# Patient Record
Sex: Male | Born: 1990 | Race: Black or African American | Hispanic: No | Marital: Single | Smoking: Former smoker
Health system: Southern US, Community
[De-identification: ages and names within clinical notes are randomized; demographics above are authoritative.]

## PROBLEM LIST (undated history)

## (undated) DIAGNOSIS — R109 Unspecified abdominal pain: Principal | ICD-10-CM

## (undated) DIAGNOSIS — J45909 Unspecified asthma, uncomplicated: Secondary | ICD-10-CM

## (undated) DIAGNOSIS — G8929 Other chronic pain: Secondary | ICD-10-CM

## (undated) HISTORY — DX: Other chronic pain: G89.29

## (undated) HISTORY — DX: Unspecified abdominal pain: R10.9

---
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
## 2008-09-20 ENCOUNTER — Emergency Department (HOSPITAL_COMMUNITY): Admission: EM | Admit: 2008-09-20 | Discharge: 2008-09-20 | Payer: Self-pay | Admitting: Emergency Medicine

## 2008-09-22 ENCOUNTER — Emergency Department (HOSPITAL_COMMUNITY): Admission: EM | Admit: 2008-09-22 | Discharge: 2008-09-22 | Payer: Self-pay | Admitting: Emergency Medicine

## 2010-12-10 ENCOUNTER — Emergency Department (HOSPITAL_COMMUNITY)
Admission: EM | Admit: 2010-12-10 | Discharge: 2010-12-10 | Disposition: A | Discharge status: Home / Self Care | Payer: Self-pay | Attending: Emergency Medicine | Admitting: Emergency Medicine

## 2010-12-10 DIAGNOSIS — IMO0002 Reserved for concepts with insufficient information to code with codable children: Secondary | ICD-10-CM | POA: Insufficient documentation

## 2010-12-10 DIAGNOSIS — B356 Tinea cruris: Secondary | ICD-10-CM | POA: Insufficient documentation

## 2010-12-10 DIAGNOSIS — W268XXA Contact with other sharp object(s), not elsewhere classified, initial encounter: Secondary | ICD-10-CM | POA: Insufficient documentation

## 2010-12-10 DIAGNOSIS — T22019A Burn of unspecified degree of unspecified forearm, initial encounter: Secondary | ICD-10-CM | POA: Insufficient documentation

## 2010-12-10 DIAGNOSIS — R21 Rash and other nonspecific skin eruption: Secondary | ICD-10-CM | POA: Insufficient documentation

## 2011-01-02 LAB — DIFFERENTIAL
Basophils Absolute: 0 10*3/uL (ref 0.0–0.1)
Basophils Relative: 0 % (ref 0–1)
Basophils Relative: 1 % (ref 0–1)
Eosinophils Absolute: 0.2 10*3/uL (ref 0.0–0.7)
Eosinophils Relative: 2 % (ref 0–5)
Lymphocytes Relative: 47 % — ABNORMAL HIGH (ref 12–46)
Lymphocytes Relative: 7 % — ABNORMAL LOW (ref 12–46)
Lymphs Abs: 1 10*3/uL (ref 0.7–4.0)
Lymphs Abs: 2.6 10*3/uL (ref 0.7–4.0)
Monocytes Absolute: 0.4 10*3/uL (ref 0.1–1.0)
Monocytes Absolute: 1.2 10*3/uL — ABNORMAL HIGH (ref 0.1–1.0)
Monocytes Relative: 8 % (ref 3–12)
Monocytes Relative: 8 % (ref 3–12)
Neutro Abs: 11.7 10*3/uL — ABNORMAL HIGH (ref 1.7–7.7)
Neutro Abs: 2 10*3/uL (ref 1.7–7.7)
Neutrophils Relative %: 83 % — ABNORMAL HIGH (ref 43–77)

## 2011-01-02 LAB — BASIC METABOLIC PANEL
BUN: 9 mg/dL (ref 6–23)
CO2: 26 mEq/L (ref 19–32)
Calcium: 10.1 mg/dL (ref 8.4–10.5)
Chloride: 102 mEq/L (ref 96–112)
Creatinine, Ser: 1.24 mg/dL (ref 0.4–1.5)
GFR calc Af Amer: >60 mL/min (ref >60)
GFR calc non Af Amer: >60 mL/min (ref >60)
Glucose, Bld: 112 mg/dL — ABNORMAL HIGH (ref 70–99)
Potassium: 3.8 mEq/L (ref 3.5–5.1)
Sodium: 138 mEq/L (ref 135–145)

## 2011-01-02 LAB — URINALYSIS, ROUTINE W REFLEX MICROSCOPIC
Bilirubin Urine: NEGATIVE
Glucose, UA: NEGATIVE mg/dL
Glucose, UA: NEGATIVE mg/dL
Hgb urine dipstick: NEGATIVE
Ketones, ur: 15 mg/dL — AB
Ketones, ur: NEGATIVE mg/dL
Leukocytes, UA: NEGATIVE
Nitrite: NEGATIVE
Nitrite: NEGATIVE
Protein, ur: >300 mg/dL — AB
Protein, ur: NEGATIVE mg/dL
Specific Gravity, Urine: 1.026 (ref 1.005–1.030)
Specific Gravity, Urine: 1.03 (ref 1.005–1.030)
Urobilinogen, UA: 0.2 mg/dL (ref 0.0–1.0)
Urobilinogen, UA: 1 mg/dL (ref 0.0–1.0)
pH: 6 (ref 5.0–8.0)
pH: 6.5 (ref 5.0–8.0)

## 2011-01-02 LAB — COMPREHENSIVE METABOLIC PANEL
Albumin: 3.5 g/dL (ref 3.5–5.2)
Alkaline Phosphatase: 71 U/L (ref 39–117)
BUN: 10 mg/dL (ref 6–23)
Potassium: 4.1 mEq/L (ref 3.5–5.1)
Total Protein: 6.4 g/dL (ref 6.0–8.3)

## 2011-01-02 LAB — CBC
HCT: 43.5 % (ref 39.0–52.0)
HCT: 50.4 % (ref 39.0–52.0)
Hemoglobin: 16.3 g/dL (ref 13.0–17.0)
MCHC: 32.3 g/dL (ref 30.0–36.0)
MCV: 83.8 fL (ref 78.0–100.0)
Platelets: 219 10*3/uL (ref 150–400)
Platelets: 232 10*3/uL (ref 150–400)
RBC: 6.01 MIL/uL — ABNORMAL HIGH (ref 4.22–5.81)
RDW: 13.7 % (ref 11.5–15.5)
RDW: 13.9 % (ref 11.5–15.5)
WBC: 14.2 10*3/uL — ABNORMAL HIGH (ref 4.0–10.5)

## 2011-01-02 LAB — URINE MICROSCOPIC-ADD ON

## 2012-10-18 ENCOUNTER — Emergency Department (HOSPITAL_COMMUNITY)
Admission: EM | Admit: 2012-10-18 | Discharge: 2012-10-18 | Payer: Self-pay | Attending: Emergency Medicine | Admitting: Emergency Medicine

## 2012-10-18 ENCOUNTER — Emergency Department (HOSPITAL_COMMUNITY): Payer: Self-pay

## 2012-10-18 ENCOUNTER — Encounter (HOSPITAL_COMMUNITY): Payer: Self-pay | Admitting: *Deleted

## 2012-10-18 DIAGNOSIS — S0093XA Contusion of unspecified part of head, initial encounter: Secondary | ICD-10-CM

## 2012-10-18 DIAGNOSIS — S1093XA Contusion of unspecified part of neck, initial encounter: Secondary | ICD-10-CM | POA: Insufficient documentation

## 2012-10-18 DIAGNOSIS — S0003XA Contusion of scalp, initial encounter: Secondary | ICD-10-CM | POA: Insufficient documentation

## 2012-10-18 DIAGNOSIS — T07XXXA Unspecified multiple injuries, initial encounter: Secondary | ICD-10-CM

## 2012-10-18 DIAGNOSIS — S8990XA Unspecified injury of unspecified lower leg, initial encounter: Secondary | ICD-10-CM | POA: Insufficient documentation

## 2012-10-18 DIAGNOSIS — S99929A Unspecified injury of unspecified foot, initial encounter: Secondary | ICD-10-CM | POA: Insufficient documentation

## 2012-10-18 DIAGNOSIS — F17200 Nicotine dependence, unspecified, uncomplicated: Secondary | ICD-10-CM | POA: Insufficient documentation

## 2012-10-18 DIAGNOSIS — H10219 Acute toxic conjunctivitis, unspecified eye: Secondary | ICD-10-CM | POA: Insufficient documentation

## 2012-10-18 DIAGNOSIS — J45909 Unspecified asthma, uncomplicated: Secondary | ICD-10-CM | POA: Insufficient documentation

## 2012-10-18 DIAGNOSIS — S0993XA Unspecified injury of face, initial encounter: Secondary | ICD-10-CM | POA: Insufficient documentation

## 2012-10-18 DIAGNOSIS — IMO0002 Reserved for concepts with insufficient information to code with codable children: Secondary | ICD-10-CM | POA: Insufficient documentation

## 2012-10-18 HISTORY — DX: Unspecified asthma, uncomplicated: J45.909

## 2012-10-18 MED ORDER — TETRACAINE HCL 0.5 % OP SOLN
1.0000 [drp] | Freq: Once | OPHTHALMIC | Status: AC
  Administered 2012-10-18: 2 [drp] via OPHTHALMIC
  Filled 2012-10-18: qty 2
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
## 2015-02-05 ENCOUNTER — Emergency Department (HOSPITAL_COMMUNITY): Payer: Self-pay

## 2015-02-05 ENCOUNTER — Emergency Department (HOSPITAL_COMMUNITY)
Admission: EM | Admit: 2015-02-05 | Discharge: 2015-02-05 | Disposition: A | Discharge status: Home / Self Care | Payer: Self-pay | Attending: Emergency Medicine | Admitting: Emergency Medicine

## 2015-02-05 ENCOUNTER — Encounter (HOSPITAL_COMMUNITY): Payer: Self-pay | Admitting: *Deleted

## 2015-02-05 DIAGNOSIS — R1084 Generalized abdominal pain: Secondary | ICD-10-CM

## 2015-02-05 DIAGNOSIS — K2970 Gastritis, unspecified, without bleeding: Secondary | ICD-10-CM | POA: Insufficient documentation

## 2015-02-05 DIAGNOSIS — J45909 Unspecified asthma, uncomplicated: Secondary | ICD-10-CM | POA: Insufficient documentation

## 2015-02-05 DIAGNOSIS — Z720 Tobacco use: Secondary | ICD-10-CM | POA: Insufficient documentation

## 2015-02-05 LAB — COMPREHENSIVE METABOLIC PANEL
ALT: 22 U/L (ref 17–63)
AST: 21 U/L (ref 15–41)
Albumin: 4.3 g/dL (ref 3.5–5.0)
Alkaline Phosphatase: 58 U/L (ref 38–126)
Anion gap: 9 (ref 5–15)
BUN: 10 mg/dL (ref 6–20)
CO2: 29 mmol/L (ref 22–32)
Calcium: 9.3 mg/dL (ref 8.9–10.3)
Chloride: 101 mmol/L (ref 101–111)
Creatinine, Ser: 1 mg/dL (ref 0.61–1.24)
GFR calc Af Amer: >60 mL/min (ref >60)
GFR calc non Af Amer: >60 mL/min (ref >60)
Glucose, Bld: 93 mg/dL (ref 65–99)
Potassium: 4.1 mmol/L (ref 3.5–5.1)
Sodium: 139 mmol/L (ref 135–145)
Total Bilirubin: 0.3 mg/dL (ref 0.3–1.2)
Total Protein: 7.3 g/dL (ref 6.5–8.1)

## 2015-02-05 LAB — CBC WITH DIFFERENTIAL/PLATELET
Basophils Absolute: 0 10*3/uL (ref 0.0–0.1)
Basophils Relative: 0 % (ref 0–1)
Eosinophils Absolute: 0.6 10*3/uL (ref 0.0–0.7)
Eosinophils Relative: 12 % — ABNORMAL HIGH (ref 0–5)
HCT: 43.8 % (ref 39.0–52.0)
Hemoglobin: 14.5 g/dL (ref 13.0–17.0)
Lymphocytes Relative: 35 % (ref 12–46)
Lymphs Abs: 1.6 10*3/uL (ref 0.7–4.0)
MCH: 26.9 pg (ref 26.0–34.0)
MCHC: 33.1 g/dL (ref 30.0–36.0)
MCV: 81.1 fL (ref 78.0–100.0)
Monocytes Absolute: 0.7 10*3/uL (ref 0.1–1.0)
Monocytes Relative: 15 % — ABNORMAL HIGH (ref 3–12)
Neutro Abs: 1.7 10*3/uL (ref 1.7–7.7)
Neutrophils Relative %: 38 % — ABNORMAL LOW (ref 43–77)
Platelets: 172 10*3/uL (ref 150–400)
RBC: 5.4 MIL/uL (ref 4.22–5.81)
RDW: 13.3 % (ref 11.5–15.5)
WBC: 4.6 10*3/uL (ref 4.0–10.5)

## 2015-02-05 LAB — URINALYSIS, ROUTINE W REFLEX MICROSCOPIC
Bilirubin Urine: NEGATIVE
Glucose, UA: NEGATIVE mg/dL
Hgb urine dipstick: NEGATIVE
Ketones, ur: NEGATIVE mg/dL
Leukocytes, UA: NEGATIVE
Nitrite: NEGATIVE
Protein, ur: NEGATIVE mg/dL
Specific Gravity, Urine: 1.021 (ref 1.005–1.030)
Urobilinogen, UA: 0.2 mg/dL (ref 0.0–1.0)
pH: 7 (ref 5.0–8.0)

## 2015-02-05 LAB — LIPASE, BLOOD: Lipase: 16 U/L — ABNORMAL LOW (ref 22–51)

## 2015-02-05 MED ORDER — SUCRALFATE 1 G PO TABS: 1.0000 g | ORAL_TABLET | Freq: Three times a day (TID) | ORAL | Status: DC

## 2015-02-05 MED ORDER — ESOMEPRAZOLE MAGNESIUM 40 MG PO CPDR: 40.0000 mg | DELAYED_RELEASE_CAPSULE | Freq: Every day | ORAL | Status: DC

## 2015-02-05 MED ORDER — IOHEXOL 300 MG/ML  SOLN
50.0000 mL | Freq: Once | INTRAMUSCULAR | Status: AC | PRN
  Administered 2015-02-05: 50 mL via ORAL

## 2015-02-05 MED ORDER — MORPHINE SULFATE 4 MG/ML IJ SOLN
4.0000 mg | Freq: Once | INTRAMUSCULAR | Status: AC
  Administered 2015-02-05: 4 mg via INTRAVENOUS
  Filled 2015-02-05: qty 1

## 2015-02-05 MED ORDER — IOHEXOL 300 MG/ML  SOLN
100.0000 mL | Freq: Once | INTRAMUSCULAR | Status: AC | PRN
  Administered 2015-02-05: 100 mL via INTRAVENOUS

## 2015-02-05 MED ORDER — SODIUM CHLORIDE 0.9 % IV BOLUS (SEPSIS)
1000.0000 mL | Freq: Once | INTRAVENOUS | Status: AC
  Administered 2015-02-05: 1000 mL via INTRAVENOUS

## 2015-02-05 MED ORDER — ONDANSETRON HCL 4 MG/2ML IJ SOLN
4.0000 mg | Freq: Once | INTRAMUSCULAR | Status: AC
  Administered 2015-02-05: 4 mg via INTRAVENOUS
  Filled 2015-02-05: qty 2
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
## 2015-02-05 NOTE — ED Notes (Signed)
Patient states for about 4 months he has these periods were he can not eat. He feels like there is a knot in his stomach and when this happens he is unable to eat or drink. He has experience weight loss due to not being able to eat. He states it will sometimes let up and he can eat. He states he has passed out but thinks it is due to not eating.

## 2015-02-05 NOTE — ED Notes (Signed)
Pt given urinal, attempting to urinate at this time.

## 2015-02-05 NOTE — ED Provider Notes (Signed)
CSN: [id number]     Arrival date & time 02/05/15  1011 History   First MD Initiated Contact with Patient 02/05/15 1034     Chief Complaint  Patient presents with  . Abdominal Pain     (Consider location/radiation/quality/duration/timing/severity/associated sxs/prior Treatment) HPI Patient presents to the emergency department with abdominal pain that has been ongoing for quite a while.  He states that over the last few months, gotten worse.  He states that he has difficulty eating because the pain is worse after eating.  He states that sometimes he has vomiting.  The patient states that he was seen several years ago for the similar type pain and states that they did not find anything wrong at that time.  The patient states that he does not have any diarrhea, weakness, dizziness, headache, blurred vision, chest pain, shortness of breath, fever, cough, back pain, dysuria, or syncope.  The patient states that seems make his condition, better or worse. Past Medical History  Diagnosis Date  . Asthma    History reviewed. No pertinent past surgical history. History reviewed. No pertinent family history. History  Substance Use Topics  . Smoking status: Current Every Day Smoker  . Smokeless tobacco: Not on file  . Alcohol Use: Yes    Review of Systems  All other systems negative except as documented in the HPI. All pertinent positives and negatives as reviewed in the HPI.  Allergies  Review of patient's allergies indicates no known allergies.  Home Medications   Prior to Admission medications   Not on File   BP 121/44 mmHg  Pulse 50  Temp(Src) 97.5 F (36.4 C) (Oral)  Resp 16  SpO2 100% Physical Exam  Constitutional: He is oriented to person, place, and time. He appears well-developed and well-nourished. No distress.  HENT:  Head: Normocephalic and atraumatic.  Mouth/Throat: Oropharynx is clear and moist.  Eyes: Pupils are equal, round, and reactive to light.  Neck: Normal  range of motion. Neck supple.  Cardiovascular: Normal rate, regular rhythm and normal heart sounds.  Exam reveals no gallop and no friction rub.   No murmur heard. Pulmonary/Chest: Effort normal and breath sounds normal. No respiratory distress.  Abdominal: Soft. Bowel sounds are normal. He exhibits no distension. There is tenderness. There is no rebound and no guarding.  Musculoskeletal: He exhibits no edema.  Neurological: He is alert and oriented to person, place, and time. He exhibits normal muscle tone. Coordination normal.  Skin: Skin is warm and dry. No rash noted. No erythema.  Psychiatric: He has a normal mood and affect. His behavior is normal.  Nursing note and vitals reviewed.   ED Course  Procedures (including critical care time) Labs Review Labs Reviewed  CBC WITH DIFFERENTIAL/PLATELET - Abnormal; Notable for the following:    Neutrophils Relative % 38 (*)    Monocytes Relative 15 (*)    Eosinophils Relative 12 (*)    All other components within normal limits  LIPASE, BLOOD - Abnormal; Notable for the following:    Lipase 16 (*)    All other components within normal limits  URINALYSIS, ROUTINE W REFLEX MICROSCOPIC  COMPREHENSIVE METABOLIC PANEL    Imaging Review Ct Abdomen Pelvis W Contrast  02/05/2015   CLINICAL DATA:  For bowel 4 months the patient has had episodes where he cannot eat. Mid abdominal pain and weight loss.  EXAM: CT ABDOMEN AND PELVIS WITH CONTRAST  TECHNIQUE: Multidetector CT imaging of the abdomen and pelvis was performed using the standard  protocol following bolus administration of intravenous contrast.  CONTRAST:  50mL OMNIPAQUE IOHEXOL 300 MG/ML SOLN, 100mL OMNIPAQUE IOHEXOL 300 MG/ML SOLN  COMPARISON:  CT of the abdomen and pelvis on 09/20/2008  FINDINGS: Lower chest: The lung bases are unremarkable.  Heart size is normal.  Upper abdomen: No focal abnormality identified within the liver, spleen, pancreas, or adrenal glands. The gallbladder is  present.  Gastrointestinal tract: The stomach and small bowel loops are normal in appearance. The appendix is well seen and has a normal appearance. Colonic loops are normal in appearance.  Pelvis: There is no free pelvic fluid. Urinary bladder and prostate have a normal appearance.  Retroperitoneum: No retroperitoneal or mesenteric adenopathy. No evidence for aortic aneurysm.  Abdominal wall: Unremarkable.  Osseous structures: Unremarkable.  IMPRESSION: No evidence for acute abnormality of the abdomen or pelvis.   Electronically Signed   By: Norva PavlovElizabeth  Brown M.D.   On: 02/05/2015 13:34    Patient will be referred to GI for further evaluation and care.  He will be placed on Carafate, Nexium.  Told to return here as needed.  The CT scan did not show any abnormalities.  Patient is advised plan and all questions were answered    Charlestine NightChristopher Adriane Guglielmo, PA-C 02/05/15 1437  Azalia BilisKevin Campos, MD 02/05/15 1535

## 2015-02-05 NOTE — Discharge Instructions (Signed)
Return here as needed.  Follow-up with the GI Dr. provided the CT scan and laboratory testing did not show any significant abnormality

## 2015-02-05 NOTE — Progress Notes (Signed)
CM spoke with pt who confirms self pay [place] resident with no pcp. CM discussed and provided written information for self pay pcps vs EDPs, importance of pcp for f/u care, www.needymeds.org, www.goodrx.com, discounted pharmacies and other [place] resources such as [facility] , [facility], affordable care act,  financial assistance, self pay dental services, [place] med assist, DSS and  health department  Reviewed resources for [place] self pay pcps like [facility], family medicine at [address], [facility], general medical clinics, [facility] urgent [facility] plus others, medication resources, [facility] out patient pharmacies and housing Pt voiced understanding and appreciation of resources provided   Provided [facility] contact information Pt agreed to a referral Cm completed referral Pt to be contact by [facility] clinical liason  

## 2015-02-05 NOTE — ED Notes (Signed)
Pt made aware we need a urine sample

## 2015-02-12 ENCOUNTER — Emergency Department (HOSPITAL_COMMUNITY)
Admission: EM | Admit: 2015-02-12 | Discharge: 2015-02-12 | Disposition: A | Discharge status: Home / Self Care | Payer: Self-pay | Attending: Emergency Medicine | Admitting: Emergency Medicine

## 2015-02-12 ENCOUNTER — Emergency Department (HOSPITAL_COMMUNITY): Payer: Self-pay

## 2015-02-12 ENCOUNTER — Encounter (HOSPITAL_COMMUNITY): Payer: Self-pay | Admitting: *Deleted

## 2015-02-12 DIAGNOSIS — Y999 Unspecified external cause status: Secondary | ICD-10-CM | POA: Insufficient documentation

## 2015-02-12 DIAGNOSIS — IMO0002 Reserved for concepts with insufficient information to code with codable children: Secondary | ICD-10-CM

## 2015-02-12 DIAGNOSIS — R200 Anesthesia of skin: Secondary | ICD-10-CM | POA: Insufficient documentation

## 2015-02-12 DIAGNOSIS — Y9361 Activity, american tackle football: Secondary | ICD-10-CM | POA: Insufficient documentation

## 2015-02-12 DIAGNOSIS — Y280XXA Contact with sharp glass, undetermined intent, initial encounter: Secondary | ICD-10-CM | POA: Insufficient documentation

## 2015-02-12 DIAGNOSIS — Z23 Encounter for immunization: Secondary | ICD-10-CM | POA: Insufficient documentation

## 2015-02-12 DIAGNOSIS — Y92321 Football field as the place of occurrence of the external cause: Secondary | ICD-10-CM | POA: Insufficient documentation

## 2015-02-12 DIAGNOSIS — Z87891 Personal history of nicotine dependence: Secondary | ICD-10-CM | POA: Insufficient documentation

## 2015-02-12 DIAGNOSIS — Z79899 Other long term (current) drug therapy: Secondary | ICD-10-CM | POA: Insufficient documentation

## 2015-02-12 DIAGNOSIS — R202 Paresthesia of skin: Secondary | ICD-10-CM | POA: Insufficient documentation

## 2015-02-12 DIAGNOSIS — S40022A Contusion of left upper arm, initial encounter: Secondary | ICD-10-CM | POA: Insufficient documentation

## 2015-02-12 DIAGNOSIS — S41112A Laceration without foreign body of left upper arm, initial encounter: Secondary | ICD-10-CM | POA: Insufficient documentation

## 2015-02-12 DIAGNOSIS — J45909 Unspecified asthma, uncomplicated: Secondary | ICD-10-CM | POA: Insufficient documentation

## 2015-02-12 MED ORDER — TETANUS-DIPHTH-ACELL PERTUSSIS 5-2.5-18.5 LF-MCG/0.5 IM SUSP
0.5000 mL | Freq: Once | INTRAMUSCULAR | Status: AC
Start: 2015-02-12 — End: 2015-02-12
  Administered 2015-02-12: 0.5 mL via INTRAMUSCULAR
  Filled 2015-02-12: qty 0.5

## 2015-02-12 MED ORDER — LIDOCAINE-EPINEPHRINE (PF) 2 %-1:200000 IJ SOLN
20.0000 mL | Freq: Once | INTRAMUSCULAR | Status: AC
  Administered 2015-02-12: 20 mL
  Filled 2015-02-12: qty 20

## 2015-02-12 MED ORDER — HYDROCODONE-ACETAMINOPHEN 5-325 MG PO TABS: 1.0000 | ORAL_TABLET | ORAL | Status: DC | PRN

## 2015-02-12 MED ORDER — OXYCODONE-ACETAMINOPHEN 5-325 MG PO TABS
1.0000 | ORAL_TABLET | Freq: Once | ORAL | Status: AC
  Administered 2015-02-12: 1 via ORAL
  Filled 2015-02-12: qty 1

## 2015-02-12 NOTE — Discharge Instructions (Signed)
Read the information below.  Use the prescribed medication as directed.  Please discuss all new medications with your pharmacist.  Do not take additional tylenol while taking the prescribed pain medication to avoid overdose.  You may return to the Emergency Department at any time for worsening condition or any new symptoms that concern you.  If you develop redness, swelling, pus draining from the wound, uncontrolled pain, or fevers greater than 100.4, return to the ER immediately for a recheck.     Laceration Care, Adult A laceration is a cut or lesion that goes through all layers of the skin and into the tissue just beneath the skin. TREATMENT  Some lacerations may not require closure. Some lacerations may not be able to be closed due to an increased risk of infection. It is important to see your caregiver as soon as possible after an injury to minimize the risk of infection and maximize the opportunity for successful closure. If closure is appropriate, pain medicines may be given, if needed. The wound will be cleaned to help prevent infection. Your caregiver will use stitches (sutures), staples, wound glue (adhesive), or skin adhesive strips to repair the laceration. These tools bring the skin edges together to allow for faster healing and a better cosmetic outcome. However, all wounds will heal with a scar. Once the wound has healed, scarring can be minimized by covering the wound with sunscreen during the day for 1 full year. HOME CARE INSTRUCTIONS  For sutures or staples:  Keep the wound clean and dry.  If you were given a bandage (dressing), you should change it at least once a day. Also, change the dressing if it becomes wet or dirty, or as directed by your caregiver.  Wash the wound with soap and water 2 times a day. Rinse the wound off with water to remove all soap. Pat the wound dry with a clean towel.  After cleaning, apply a thin layer of the antibiotic ointment as recommended by your  caregiver. This will help prevent infection and keep the dressing from sticking.  You may shower as usual after the first 24 hours. Do not soak the wound in water until the sutures are removed.  Only take over-the-counter or prescription medicines for pain, discomfort, or fever as directed by your caregiver.  Get your sutures or staples removed as directed by your caregiver. For skin adhesive strips:  Keep the wound clean and dry.  Do not get the skin adhesive strips wet. You may bathe carefully, using caution to keep the wound dry.  If the wound gets wet, pat it dry with a clean towel.  Skin adhesive strips will fall off on their own. You may trim the strips as the wound heals. Do not remove skin adhesive strips that are still stuck to the wound. They will fall off in time. For wound adhesive:  You may briefly wet your wound in the shower or bath. Do not soak or scrub the wound. Do not swim. Avoid periods of heavy perspiration until the skin adhesive has fallen off on its own. After showering or bathing, gently pat the wound dry with a clean towel.  Do not apply liquid medicine, cream medicine, or ointment medicine to your wound while the skin adhesive is in place. This may loosen the film before your wound is healed.  If a dressing is placed over the wound, be careful not to apply tape directly over the skin adhesive. This may cause the adhesive to be pulled  off before the wound is healed.  Avoid prolonged exposure to sunlight or tanning lamps while the skin adhesive is in place. Exposure to ultraviolet light in the first year will darken the scar.  The skin adhesive will usually remain in place for 5 to 10 days, then naturally fall off the skin. Do not pick at the adhesive film. You may need a tetanus shot if:  You cannot remember when you had your last tetanus shot.  You have never had a tetanus shot. If you get a tetanus shot, your arm may swell, get red, and feel warm to the  touch. This is common and not a problem. If you need a tetanus shot and you choose not to have one, there is a rare chance of getting tetanus. Sickness from tetanus can be serious. SEEK MEDICAL CARE IF:   You have redness, swelling, or increasing pain in the wound.  You see a red line that goes away from the wound.  You have yellowish-white fluid (pus) coming from the wound.  You have a fever.  You notice a bad smell coming from the wound or dressing.  Your wound breaks open before or after sutures have been removed.  You notice something coming out of the wound such as wood or glass.  Your wound is on your hand or foot and you cannot move a finger or toe. SEEK IMMEDIATE MEDICAL CARE IF:   Your pain is not controlled with prescribed medicine.  You have severe swelling around the wound causing pain and numbness or a change in color in your arm, hand, leg, or foot.  Your wound splits open and starts bleeding.  You have worsening numbness, weakness, or loss of function of any joint around or beyond the wound.  You develop painful lumps near the wound or on the skin anywhere on your body. MAKE SURE YOU:   Understand these instructions.  Will watch your condition.  Will get help right away if you are not doing well or get worse. Document Released: 09/04/2005 Document Revised: 11/27/2011 Document Reviewed: 02/28/2011 [facility] Patient Information 2015 [place], [place]. This information is not intended to replace advice given to you by your health care provider. Make sure you discuss any questions you have with your health care provider.  Hematoma A hematoma is a collection of blood under the skin, in an organ, in a body space, in a joint space, or in other tissue. The blood can clot to form a lump that you can see and feel. The lump is often firm and may sometimes become sore and tender. Most hematomas get better in a few days to weeks. However, some hematomas may be serious and  require medical care. Hematomas can range in size from very small to very large. CAUSES  A hematoma can be caused by a blunt or penetrating injury. It can also be caused by spontaneous leakage from a blood vessel under the skin. Spontaneous leakage from a blood vessel is more likely to occur in older people, especially those taking blood thinners. Sometimes, a hematoma can develop after certain medical procedures. SIGNS AND SYMPTOMS   A firm lump on the body.  Possible pain and tenderness in the area.  Bruising.Blue, dark blue, purple-red, or yellowish skin may appear at the site of the hematoma if the hematoma is close to the surface of the skin. For hematomas in deeper tissues or body spaces, the signs and symptoms may be subtle. For example, an intra-abdominal hematoma may cause  abdominal pain, weakness, fainting, and shortness of breath. An intracranial hematoma may cause a headache or symptoms such as weakness, trouble speaking, or a change in consciousness. DIAGNOSIS  A hematoma can usually be diagnosed based on your medical history and a physical exam. Imaging tests may be needed if your health care provider suspects a hematoma in deeper tissues or body spaces, such as the abdomen, head, or chest. These tests may include ultrasonography or a CT scan.  TREATMENT  Hematomas usually go away on their own over time. Rarely does the blood need to be drained out of the body. Large hematomas or those that may affect vital organs will sometimes need surgical drainage or monitoring. HOME CARE INSTRUCTIONS   Apply ice to the injured area:   Put ice in a plastic bag.   Place a towel between your skin and the bag.   Leave the ice on for 20 minutes, 2-3 times a day for the first 1 to 2 days.   After the first 2 days, switch to using warm compresses on the hematoma.   Elevate the injured area to help decrease pain and swelling. Wrapping the area with an elastic bandage may also be helpful.  Compression helps to reduce swelling and promotes shrinking of the hematoma. Make sure the bandage is not wrapped too tight.   If your hematoma is on a lower extremity and is painful, crutches may be helpful for a couple days.   Only take over-the-counter or prescription medicines as directed by your health care provider. SEEK IMMEDIATE MEDICAL CARE IF:   You have increasing pain, or your pain is not controlled with medicine.   You have a fever.   You have worsening swelling or discoloration.   Your skin over the hematoma breaks or starts bleeding.   Your hematoma is in your chest or abdomen and you have weakness, shortness of breath, or a change in consciousness.  Your hematoma is on your scalp (caused by a fall or injury) and you have a worsening headache or a change in alertness or consciousness. MAKE SURE YOU:   Understand these instructions.  Will watch your condition.  Will get help right away if you are not doing well or get worse. Document Released: 04/18/2004 Document Revised: 05/07/2013 Document Reviewed: 02/12/2013 [facility] Patient Information 2015 [place], [place]. This information is not intended to replace advice given to you by your health care provider. Make sure you discuss any questions you have with your health care provider.  Paresthesia Paresthesia is an abnormal burning or prickling sensation. This sensation is generally felt in the hands, arms, legs, or feet. However, it may occur in any part of the body. It is usually not painful. The feeling may be described as:  Tingling or numbness.  "Pins and needles."  Skin crawling.  Buzzing.  Limbs "falling asleep."  Itching. Most people experience temporary (transient) paresthesia at some time in their lives. CAUSES  Paresthesia may occur when you breathe too quickly (hyperventilation). It can also occur without any apparent cause. Commonly, paresthesia occurs when pressure is placed on a nerve. The  feeling quickly goes away once the pressure is removed. For some people, however, paresthesia is a long-lasting (chronic) condition caused by an underlying disorder. The underlying disorder may be:  A traumatic, direct injury to nerves. Examples include a:  Broken (fractured) neck.  Fractured skull.  A disorder affecting the brain and spinal cord (central nervous system). Examples include:  Transverse myelitis.  Encephalitis.  Transient  ischemic attack.  Multiple sclerosis.  Stroke.  Tumor or blood vessel problems, such as an arteriovenous malformation pressing against the brain or spinal cord.  A condition that damages the peripheral nerves (peripheral neuropathy). Peripheral nerves are not part of the brain and spinal cord. These conditions include:  Diabetes.  Peripheral vascular disease.  Nerve entrapment syndromes, such as carpal tunnel syndrome.  Shingles.  Hypothyroidism.  Vitamin B12 deficiencies.  Alcoholism.  Heavy metal poisoning (lead, arsenic).  Rheumatoid arthritis.  Systemic lupus erythematosus. DIAGNOSIS  Your caregiver will attempt to find the underlying cause of your paresthesia. Your caregiver may:  Take your medical history.  Perform a physical exam.  Order various lab tests.  Order imaging tests. TREATMENT  Treatment for paresthesia depends on the underlying cause. HOME CARE INSTRUCTIONS  Avoid drinking alcohol.  You may consider massage or acupuncture to help relieve your symptoms.  Keep all follow-up appointments as directed by your caregiver. SEEK IMMEDIATE MEDICAL CARE IF:   You feel weak.  You have trouble walking or moving.  You have problems with speech or vision.  You feel confused.  You cannot control your bladder or bowel movements.  You feel numbness after an injury.  You faint.  Your burning or prickling feeling gets worse when walking.  You have pain, cramps, or dizziness.  You develop a rash. MAKE  SURE YOU:  Understand these instructions.  Will watch your condition.  Will get help right away if you are not doing well or get worse. Document Released: 08/25/2002 Document Revised: 11/27/2011 Document Reviewed: 05/26/2011 [facility] Patient Information 2015 [place]. This information is not intended to replace advice given to you by your health care provider. Make sure you discuss any questions you have with your health care provider.

## 2015-02-12 NOTE — ED Notes (Signed)
Laceration to the left upper arm.  Bleeding controlled  Playing football and ran into a broken window  Moves fingers, wrist and elbow without difficulty

## 2015-02-12 NOTE — Consult Note (Signed)
Reason for Consult:LEFT ARM LACERATION [facility]  Favio Jerene BearsM Gorniak is an 24 y.o. male.  HPI: PT SUSTAINED OPEN WOUND AFTER STRIKING A PIECE OF BROKEN GLASS PRESENT TO ED WITH OPEN WOUND AND PAIN TO LEFT ARM NO PRIOR INJURY TO LEFT ARM PT ALSO C/O TINGLING IN ULNAR TWO DIGITS  Past Medical History  Diagnosis Date  . Asthma     History reviewed. No pertinent past surgical history.  No family history on file.  Social History:  reports that he quit smoking about 2 months ago. He has never used smokeless tobacco. He reports that he drinks alcohol. He reports that he uses illicit drugs.  Allergies: No Known Allergies  Medications: I have reviewed the patient's current medications.  No results found for this or any previous visit (from the past 48 hour(s)).  Dg Elbow Complete Left  02/12/2015   CLINICAL DATA:  Laceration, cut left elbow on class window  EXAM: LEFT ELBOW - COMPLETE 3+ VIEW  COMPARISON:  None.  FINDINGS: Four views of left elbow submitted. No acute fracture or subluxation. No radiopaque foreign body. No posterior fat pad sign.  IMPRESSION: Negative.   Electronically Signed   By: Natasha MeadLiviu  Pop M.D.   On: 02/12/2015 21:01   Dg Humerus Left  02/12/2015   CLINICAL DATA:  Cut left arm and elbow on glass window, with laceration extending about the forearm and elbow. Initial encounter.  EXAM: LEFT HUMERUS - 2+ VIEW  COMPARISON:  None.  FINDINGS: The known soft tissue laceration is partially characterized, with mild soft tissue air. No radiopaque foreign bodies are seen.  There is no evidence of fracture or dislocation. The elbow joint is grossly unremarkable in appearance, though better assessed on concurrent elbow radiographs. The left humeral head remains seated at the glenoid fossa. The left acromioclavicular joint is unremarkable in appearance. The visualized portions of the left lung are clear.  IMPRESSION: No evidence of fracture or dislocation. Mild  soft tissue air noted at the site of the soft tissue laceration. No radiopaque foreign bodies seen.   Electronically Signed   By: Roanna RaiderJeffery  Chang M.D.   On: 02/12/2015 21:01    ROSNO RECENT ILLNESSES OR HOSPITALIZATIONS Blood pressure 133/74, pulse 67, temperature 98.5 F (36.9 C), resp. rate 16, height 6' (1.829 m), weight 74.844 kg (165 lb), SpO2 100 %. Physical Exam  General Appearance:  Alert, cooperative, no distress, appears stated age  Head:  Normocephalic, without obvious abnormality, atraumatic  Eyes:  Pupils equal, conjunctiva/corneas clear,         Throat: Lips, mucosa, and tongue normal; teeth and gums normal  Neck: No visible masses     Lungs:   respirations unlabored  Chest Wall:  No tenderness or deformity  Heart:  Regular rate and rhythm,  Abdomen:   Soft, non-tender,         Extremities: LEFT ARM 3 CM LATERAL WOUND PROXIMAL TO ELBOW JOINT WITH MILD BLEEDING ABLE TO CROSS FINGERS ABLE TO EXTEND WRIST AND EXTEND DIGITS ABLE TO PALMAR ABDUCT THUMB GOOD WRIST AND DIGITAL MOBILITY  Pulses: 2+ and symmetric  Skin: Skin color, texture, turgor normal, no rashes or lesions     Neurologic: Normal    Assessment/Plan: LEFT ARM LACERATION WITHOUT TENDON NERVE INVOLVEMENT  LEFT ARM WOUND CLOSURE TO BE DONE BY ED WILL F/U WITH ED FOR SUTURE REMOVAL LOCAL WOUND CARE INSTRUCTIONS   Sharma CovertORTMANN,Loralye Loberg W 02/12/2015, 9:39 PM

## 2015-02-12 NOTE — ED Provider Notes (Signed)
CSN: [id number]     Arrival date & time 02/12/15  1945 History   This chart was scribed for Trixie DredgeEmily Ordell Prichett, PA-C working with Geoffery Lyonsouglas Delo, MD by Evon Slackerrance Branch, ED Scribe. This patient was seen in room TR08C/TR08C and the patient's care was started at 8:32 PM.      Chief Complaint  Patient presents with  . Laceration   Patient is a 24 y.o. male presenting with skin laceration. The history is provided by the patient. No language interpreter was used.  Laceration  HPI Comments: Darryl Reyes is a 24 y.o. male who presents to the Emergency Department complaining of laceration to his left upper arm. Pt reports tingling that's radiates down from his elbow to his 4th and 5th digit. Pt states that he was playing football and ran into a broken glass window. Pt states that the pain is worse with movement of his elbow.  Also reports altered sensation to the 4th and 5th fingers and radiating up to his elbow. Pt states that he is left hand dominant. Pt doesn't report any medication PTA. Pt denies weakness.   Denies hitting head or LOC.  Denies any other pain or injury.   Past Medical History  Diagnosis Date  . Asthma    History reviewed. No pertinent past surgical history. No family history on file. History  Substance Use Topics  . Smoking status: Former Smoker    Quit date: 12/13/2014  . Smokeless tobacco: Never Used  . Alcohol Use: Yes    Review of Systems  Constitutional: Negative for fever and chills.  Cardiovascular: Negative for chest pain.  Gastrointestinal: Negative for abdominal pain.  Musculoskeletal: Positive for arthralgias. Negative for back pain.  Skin: Positive for wound.  Allergic/Immunologic: Negative for immunocompromised state.  Neurological: Positive for numbness. Negative for syncope, weakness and headaches.  Hematological: Does not bruise/bleed easily.  Psychiatric/Behavioral: Negative for self-injury (accidental).    Allergies  Review of patient's allergies  indicates no known allergies.  Home Medications   Prior to Admission medications   Medication Sig Start Date End Date Taking? Authorizing Provider  esomeprazole (NEXIUM) 40 MG capsule Take 1 capsule (40 mg total) by mouth daily. 02/05/15   Charlestine Nighthristopher Lawyer, PA-C  sucralfate (CARAFATE) 1 G tablet Take 1 tablet (1 g total) by mouth 4 (four) times daily -  with meals and at bedtime. 02/05/15   Christopher Lawyer, PA-C   BP 133/74 mmHg  Pulse 67  Temp(Src) 98.5 F (36.9 C)  Resp 16  Ht 6' (1.829 m)  Wt 165 lb (74.844 kg)  BMI 22.37 kg/m2  SpO2 100%   Physical Exam  Constitutional: He appears well-developed and well-nourished. No distress.  HENT:  Head: Normocephalic and atraumatic.  Eyes: Conjunctivae are normal.  Neck: Normal range of motion. Neck supple.  Pulmonary/Chest: Effort normal.  Musculoskeletal:       Left elbow: He exhibits laceration.  LEFT UPPER EXTREMITY: Laceration to the posteriolateral distal upper arm,  Pt able to flex elbow to 90 degrees and nearly fully extend Distal pulses intact, decreased sensation to 4th and 5th digits and throughout ulnar distrubution to elbow. Grip strength 5/5. Able to move all fingers of left hand.  Wound hemostatic but begins bleeding with ranging elbow.  Hematoma just distal to the wound.    Neurological: He is alert. He exhibits normal muscle tone.  Skin: He is not diaphoretic.  Psychiatric: He has a normal mood and affect. His behavior is normal.  Nursing note and vitals  reviewed.   ED Course  Procedures (including critical care time) DIAGNOSTIC STUDIES: Oxygen Saturation is 100% on RA, normal by my interpretation.    COORDINATION OF CARE: 8:36 PM-Discussed treatment plan with pt at bedside and pt agreed to plan.     Labs Review Labs Reviewed - No data to display  Imaging Review Dg Elbow Complete Left  02/12/2015   CLINICAL DATA:  Laceration, cut left elbow on class window  EXAM: LEFT ELBOW - COMPLETE 3+ VIEW   COMPARISON:  None.  FINDINGS: Four views of left elbow submitted. No acute fracture or subluxation. No radiopaque foreign body. No posterior fat pad sign.  IMPRESSION: Negative.   Electronically Signed   By: Natasha Mead M.D.   On: 02/12/2015 21:01   Dg Humerus Left  02/12/2015   CLINICAL DATA:  Cut left arm and elbow on glass window, with laceration extending about the forearm and elbow. Initial encounter.  EXAM: LEFT HUMERUS - 2+ VIEW  COMPARISON:  None.  FINDINGS: The known soft tissue laceration is partially characterized, with mild soft tissue air. No radiopaque foreign bodies are seen.  There is no evidence of fracture or dislocation. The elbow joint is grossly unremarkable in appearance, though better assessed on concurrent elbow radiographs. The left humeral head remains seated at the glenoid fossa. The left acromioclavicular joint is unremarkable in appearance. The visualized portions of the left lung are clear.  IMPRESSION: No evidence of fracture or dislocation. Mild soft tissue air noted at the site of the soft tissue laceration. No radiopaque foreign bodies seen.   Electronically Signed   By: Roanna Raider M.D.   On: 02/12/2015 21:01     EKG Interpretation None           9:30 PM Discussed pt with Dr Judd Lien who agrees with hand surgery consultation for discussion.  9:39 PM I discussed the patient with Dr Melvyn Novas who has also seen the patient in the ED.  He has reviewed the films.  He commends laceration repair in ED with ED follow up for suture removal.  Pt has large hematoma to posterior elbow which is likely causing the paresthesia and should resolve as the hematoma resolves.    LACERATION REPAIR Performed by: Trixie Dredge Authorized by: Trixie Dredge Consent: Verbal consent obtained. Risks and benefits: risks, benefits and alternatives were discussed Consent given by: patient Patient identity confirmed: provided demographic data Prepped and Draped in normal sterile fashion Wound  explored  Laceration Location: left upper arm  Laceration Length: 4cm  No Foreign Bodies seen or palpated  Anesthesia: local infiltration  Local anesthetic: lidocaine 2% with epinephrine  Anesthetic total: 6 ml  Irrigation method: syringe Amount of cleaning: standard, thoroughly irrigated and explored  Skin closure: 4-0 ethilon  Number of sutures: 5  Technique: simple interrupted  Patient tolerance: Patient tolerated the procedure well with no immediate complications.   MDM   Final diagnoses:  Laceration  Arm laceration, left, initial encounter  Hematoma of arm, left, initial encounter  Paresthesia of left arm   Afebrile, nontoxic patient with injury to his left upper arm while playing football, accidentally ran into broken glass.   Xray negative for FB or fracture.  No FB on exam.  Thoroughly irrigated and explored.  Likely puncture wound.  Pt is immunocompetent.  Please see discussion about with Dr Melvyn Novas regarding paresthesia and plan for this pain.   D/C home with pain medication, ED follow up.  Discussed result, findings, treatment, and follow up  with patient.  Pt given return precautions.  Pt verbalizes understanding and agrees with plan.        I personally performed the services described in this documentation, which was scribed in my presence. The recorded information has been reviewed and is accurate.      Trixie Dredge, PA-C 02/12/15 2315  Geoffery Lyons, MD 02/13/15 [phone]

## 2015-02-24 ENCOUNTER — Ambulatory Visit: Payer: Self-pay | Attending: Internal Medicine

## 2015-02-26 NOTE — Progress Notes (Signed)
24 yr old [place] resident with no insurance and no pcp  CM received a call from [place] [facility] ED stating pt called in to ED inquiring about getting a medication refill CM spoke with pt who confirms he was seen at [facility] ED 5/20-21/ 2016 for abdominal pain (noting pt also returned on 02/12/15 for arm laceration)   CM discussed and provided written information for self pay pcps, discussed the importance of pcp vs EDP services for f/u care,other [employer] such as [facility] , [facility], and [facility] CM discussed difference in pcp and EDPs Encouraged to get pcp to complete medication refill Pt asked for [facility] contact information He said he would go there  CM provided information Pt voiced understanding and appreciation of resources provided   Pt states he has already attempt to get an orange card already CM able to see in EPIC pt listed [facility] financial appt for 02/13/15

## 2015-03-16 ENCOUNTER — Ambulatory Visit: Payer: Self-pay

## 2015-04-26 ENCOUNTER — Other Ambulatory Visit: Payer: Self-pay | Admitting: Gastroenterology

## 2015-04-26 DIAGNOSIS — K279 Peptic ulcer, site unspecified, unspecified as acute or chronic, without hemorrhage or perforation: Secondary | ICD-10-CM

## 2015-04-30 ENCOUNTER — Encounter (HOSPITAL_COMMUNITY): Payer: Self-pay | Admitting: *Deleted

## 2015-05-04 ENCOUNTER — Other Ambulatory Visit: Payer: Self-pay | Admitting: Gastroenterology

## 2015-05-04 NOTE — Addendum Note (Signed)
Addended byVida Rigger on: 05/04/2015 02:46 PM   Modules accepted: Orders

## 2015-05-05 ENCOUNTER — Encounter (HOSPITAL_COMMUNITY): Payer: Self-pay | Admitting: *Deleted

## 2015-05-05 ENCOUNTER — Encounter (HOSPITAL_COMMUNITY)
Admission: RE | Disposition: A | Discharge status: Home / Self Care | Payer: Self-pay | Source: Ambulatory Visit | Attending: Gastroenterology

## 2015-05-05 ENCOUNTER — Ambulatory Visit (HOSPITAL_COMMUNITY)
Admission: RE | Admit: 2015-05-05 | Discharge: 2015-05-05 | Disposition: A | Discharge status: Home / Self Care | Payer: Self-pay | Source: Ambulatory Visit | Attending: Gastroenterology | Admitting: Gastroenterology

## 2015-05-05 ENCOUNTER — Ambulatory Visit (HOSPITAL_COMMUNITY): Payer: Self-pay | Admitting: Anesthesiology

## 2015-05-05 DIAGNOSIS — Z6820 Body mass index (BMI) 20.0-20.9, adult: Secondary | ICD-10-CM | POA: Insufficient documentation

## 2015-05-05 DIAGNOSIS — R634 Abnormal weight loss: Secondary | ICD-10-CM | POA: Insufficient documentation

## 2015-05-05 DIAGNOSIS — Z87891 Personal history of nicotine dependence: Secondary | ICD-10-CM | POA: Insufficient documentation

## 2015-05-05 DIAGNOSIS — R1013 Epigastric pain: Secondary | ICD-10-CM | POA: Insufficient documentation

## 2015-05-05 DIAGNOSIS — Z79899 Other long term (current) drug therapy: Secondary | ICD-10-CM | POA: Insufficient documentation

## 2015-05-05 DIAGNOSIS — F1210 Cannabis abuse, uncomplicated: Secondary | ICD-10-CM | POA: Insufficient documentation

## 2015-05-05 DIAGNOSIS — J45909 Unspecified asthma, uncomplicated: Secondary | ICD-10-CM | POA: Insufficient documentation

## 2015-05-05 HISTORY — PX: ESOPHAGOGASTRODUODENOSCOPY: SHX5428

## 2015-05-05 SURGERY — EGD (ESOPHAGOGASTRODUODENOSCOPY)
Anesthesia: Monitor Anesthesia Care

## 2015-05-05 MED ORDER — MIDAZOLAM HCL 2 MG/2ML IJ SOLN
INTRAMUSCULAR | Status: AC
  Filled 2015-05-05: qty 4

## 2015-05-05 MED ORDER — PROPOFOL 10 MG/ML IV BOLUS
INTRAVENOUS | Status: AC
  Filled 2015-05-05: qty 20

## 2015-05-05 MED ORDER — SODIUM CHLORIDE 0.9 % IV SOLN: INTRAVENOUS | Status: DC

## 2015-05-05 MED ORDER — PROPOFOL INFUSION 10 MG/ML OPTIME
INTRAVENOUS | Status: DC | PRN
  Administered 2015-05-05: 20 ug/kg/min via INTRAVENOUS

## 2015-05-05 MED ORDER — MIDAZOLAM HCL 5 MG/5ML IJ SOLN
INTRAMUSCULAR | Status: DC | PRN
  Administered 2015-05-05: 2 mg via INTRAVENOUS

## 2015-05-05 MED ORDER — LIDOCAINE HCL 1 % IJ SOLN
INTRAMUSCULAR | Status: DC | PRN
  Administered 2015-05-05: 50 mg via INTRADERMAL

## 2015-05-05 MED ORDER — LACTATED RINGERS IV SOLN
INTRAVENOUS | Status: DC
  Administered 2015-05-05: 1000 mL via INTRAVENOUS

## 2015-05-05 MED ORDER — PROPOFOL 10 MG/ML IV BOLUS
INTRAVENOUS | Status: DC | PRN
  Administered 2015-05-05: 25 mg via INTRAVENOUS
  Administered 2015-05-05: 50 mg via INTRAVENOUS
  Administered 2015-05-05: 25 mg via INTRAVENOUS

## 2015-05-05 NOTE — Op Note (Signed)
[facility] [address] [place], [address]   ENDOSCOPY PROCEDURE REPORT  PATIENT: Darryl, Reyes  MR#: [id number] BIRTHDATE: [birth year] , 24  yrs. old GENDER: male ENDOSCOPIST: Vida Rigger, MD REFERRED BY: PROCEDURE DATE:  05/29/15 PROCEDURE:  EGD, diagnostic ASA CLASS:     Class I INDICATIONS:  epigastric abdominal pain and weight loss. MEDICATIONS: Propofol 100, Lidocaine 50 mg IV, and Versed 2 mg IV  TOPICAL ANESTHETIC: none  DESCRIPTION OF PROCEDURE: After the risks benefits and alternatives of the procedure were thoroughly explained, informed consent was obtained.  The Pentax Gastroscope [id number] endoscope was introduced through the mouth and advanced to the second portion of the duodenum , Without limitations.  The instrument was slowly withdrawn as the mucosa was fully examined. Estimated blood loss is zero unless otherwise noted in this procedure report.    the findings are recorded below       Retroflexed views revealed no abnormalities.     The scope was then withdrawn from the patient and the procedure completed.  COMPLICATIONS: There were no immediate complications.  ENDOSCOPIC IMPRESSION: 1. Questionable tiny hiatal hernia 2. Otherwise within normal limits EGD to the second portion of the duodenum  RECOMMENDATIONS: consider repeat labs including thyroid if not done and consider a trial of anti-spasmodic's and possibly a gallbladder ultrasound or small bowel follow-through next and happy see back when necessary  REPEAT EXAM: as needed  eSigned:  Vida Rigger, MD 05-29-2015 10:55 AM    CC:  CPT CODES: ICD CODES:  The ICD and CPT codes recommended by this software are interpretations from the data that the clinical staff has captured with the software.  The verification of the translation of this report to the ICD and CPT codes and modifiers is the sole responsibility of the health care institution and  practicing physician where this report was generated.  [employer] will not be held responsible for the validity of the ICD and CPT codes included on this report.  AMA assumes no liability for data contained or not contained herein. CPT is a Publishing rights manager of the [employer].  PATIENT NAME:  Darryl, Reyes MR#: [id number]

## 2015-05-05 NOTE — Anesthesia Postprocedure Evaluation (Signed)
  Anesthesia Post-op Note  Patient: Darryl Reyes  Procedure(s) Performed: Procedure(s) (LRB): ESOPHAGOGASTRODUODENOSCOPY (EGD) (N/A)  Patient Location: PACU  Anesthesia Type: MAC  Level of Consciousness: awake and alert   Airway and Oxygen Therapy: Patient Spontanous Breathing  Post-op Pain: mild  Post-op Assessment: Post-op Vital signs reviewed, Patient's Cardiovascular Status Stable, Respiratory Function Stable, Patent Airway and No signs of Nausea or vomiting  Last Vitals:  Filed Vitals:   05/05/15 1120  BP: 112/56  Pulse: 39  Temp:   Resp: 16    Post-op Vital Signs: stable   Complications: No apparent anesthesia complications

## 2015-05-05 NOTE — Anesthesia Preprocedure Evaluation (Addendum)
Anesthesia Evaluation  Patient identified by MRN, date of birth, ID band Patient awake    Reviewed: Allergy & Precautions, NPO status , Patient's Chart, lab work & pertinent test results  Airway Mallampati: II  TM Distance: >3 FB Neck ROM: Full    Dental no notable dental hx.    Pulmonary asthma , former smoker,  breath sounds clear to auscultation  Pulmonary exam normal       Cardiovascular negative cardio ROS Normal cardiovascular examRhythm:Regular Rate:Normal     Neuro/Psych negative neurological ROS  negative psych ROS   GI/Hepatic negative GI ROS, (+)     substance abuse  marijuana use,   Endo/Other  negative endocrine ROS  Renal/GU negative Renal ROS  negative genitourinary   Musculoskeletal negative musculoskeletal ROS (+)   Abdominal   Peds negative pediatric ROS (+)  Hematology negative hematology ROS (+)   Anesthesia Other Findings   Reproductive/Obstetrics negative OB ROS                            Anesthesia Physical Anesthesia Plan  ASA: II  Anesthesia Plan: MAC   Post-op Pain Management:    Induction: Intravenous  Airway Management Planned: Natural Airway  Additional Equipment:   Intra-op Plan:   Post-operative Plan:   Informed Consent: I have reviewed the patients History and Physical, chart, labs and discussed the procedure including the risks, benefits and alternatives for the proposed anesthesia with the patient or authorized representative who has indicated his/her understanding and acceptance.   Dental advisory given  Plan Discussed with: CRNA  Anesthesia Plan Comments:         Anesthesia Quick Evaluation

## 2015-05-05 NOTE — Discharge Instructions (Addendum)
Call if question or problem otherwise follow-up with your primary care physician and I have sent them recommendations for other tests in the future or other medicines to try Esophagogastroduodenoscopy Care After Refer to this sheet in the next few weeks. These instructions provide you with information on caring for yourself after your procedure. Your caregiver may also give you more specific instructions. Your treatment has been planned according to current medical practices, but problems sometimes occur. Call your caregiver if you have any problems or questions after your procedure.  HOME CARE INSTRUCTIONS  Do not eat or drink anything until the numbing medicine (local anesthetic) has worn off and your gag reflex has returned. You will know that the local anesthetic has worn off when you can swallow comfortably.  Do not drive for 12 hours after the procedure or as directed by your caregiver.  Only take medicines as directed by your caregiver. SEEK MEDICAL CARE IF:   You cannot stop coughing.  You are not urinating at all or less than usual. SEEK IMMEDIATE MEDICAL CARE IF:  You have difficulty swallowing.  You cannot eat or drink.  You have worsening throat or chest pain.  You have dizziness, lightheadedness, or you faint.  You have nausea or vomiting.  You have chills.  You have a fever.  You have severe abdominal pain.  You have black, tarry, or bloody stools. Document Released: 08/21/2012 Document Reviewed: 08/21/2012 [facility] Patient Information 2015 [place], [place]. This information is not intended to replace advice given to you by your health care provider. Make sure you discuss any questions you have with your health care provider.

## 2015-05-05 NOTE — Progress Notes (Signed)
Darryl Reyes 10:27 AM  Subjective: Patient without any new problems since I saw him recently in the office and he has no new complaints  Objective: Vital signs stable afebrile no acute distress exam please see preassessment evaluation  Assessment: Abdominal pain and weight loss questionable etiology  Plan: Okay to proceed with endoscopy with anesthesia assistance today  [facility]  Pager [phone] After 5PM or if no answer call [phone]

## 2015-05-05 NOTE — Transfer of Care (Signed)
Immediate Anesthesia Transfer of Care Note  Patient: EUSEVIO SCHRIVER  Procedure(s) Performed: Procedure(s): ESOPHAGOGASTRODUODENOSCOPY (EGD) (N/A)  Patient Location: PACU and Endoscopy Unit  Anesthesia Type:MAC  Level of Consciousness: awake, oriented and patient cooperative  Airway & Oxygen Therapy: Patient connected to nasal cannula oxygen  Post-op Assessment: Report given to RN and Post -op Vital signs reviewed and stable  Post vital signs: Reviewed and stable  Last Vitals:  Filed Vitals:   05/05/15 1059  BP: 126/74  Pulse: 64  Temp:   Resp: 10    Complications: No apparent anesthesia complications

## 2015-05-06 ENCOUNTER — Encounter (HOSPITAL_COMMUNITY): Payer: Self-pay | Admitting: Gastroenterology

## 2015-09-05 ENCOUNTER — Emergency Department (HOSPITAL_COMMUNITY): Payer: Self-pay

## 2015-09-05 ENCOUNTER — Emergency Department (HOSPITAL_COMMUNITY)
Admission: EM | Admit: 2015-09-05 | Discharge: 2015-09-05 | Disposition: A | Discharge status: Home / Self Care | Payer: Self-pay | Attending: Emergency Medicine | Admitting: Emergency Medicine

## 2015-09-05 DIAGNOSIS — R112 Nausea with vomiting, unspecified: Secondary | ICD-10-CM | POA: Insufficient documentation

## 2015-09-05 DIAGNOSIS — R1012 Left upper quadrant pain: Secondary | ICD-10-CM | POA: Insufficient documentation

## 2015-09-05 DIAGNOSIS — J45909 Unspecified asthma, uncomplicated: Secondary | ICD-10-CM | POA: Insufficient documentation

## 2015-09-05 DIAGNOSIS — R1032 Left lower quadrant pain: Secondary | ICD-10-CM | POA: Insufficient documentation

## 2015-09-05 DIAGNOSIS — G8929 Other chronic pain: Secondary | ICD-10-CM | POA: Insufficient documentation

## 2015-09-05 DIAGNOSIS — Z87891 Personal history of nicotine dependence: Secondary | ICD-10-CM | POA: Insufficient documentation

## 2015-09-05 LAB — COMPREHENSIVE METABOLIC PANEL
ALBUMIN: 4.9 g/dL (ref 3.5–5.0)
ALK PHOS: 59 U/L (ref 38–126)
ALT: 46 U/L (ref 17–63)
AST: 34 U/L (ref 15–41)
Anion gap: 10 (ref 5–15)
BUN: 14 mg/dL (ref 6–20)
CALCIUM: 9.5 mg/dL (ref 8.9–10.3)
CO2: 28 mmol/L (ref 22–32)
CREATININE: 0.96 mg/dL (ref 0.61–1.24)
Chloride: 102 mmol/L (ref 101–111)
GFR calc Af Amer: >60 mL/min (ref >60)
GFR calc non Af Amer: >60 mL/min (ref >60)
GLUCOSE: 99 mg/dL (ref 65–99)
Potassium: 3.4 mmol/L — ABNORMAL LOW (ref 3.5–5.1)
SODIUM: 140 mmol/L (ref 135–145)
Total Bilirubin: 1 mg/dL (ref 0.3–1.2)
Total Protein: 7.8 g/dL (ref 6.5–8.1)

## 2015-09-05 LAB — LIPASE, BLOOD: Lipase: 22 U/L (ref 11–51)

## 2015-09-05 LAB — URINE MICROSCOPIC-ADD ON

## 2015-09-05 LAB — CBC
HCT: 45.2 % (ref 39.0–52.0)
Hemoglobin: 15.2 g/dL (ref 13.0–17.0)
MCH: 27.2 pg (ref 26.0–34.0)
MCHC: 33.6 g/dL (ref 30.0–36.0)
MCV: 80.9 fL (ref 78.0–100.0)
PLATELETS: 170 10*3/uL (ref 150–400)
RBC: 5.59 MIL/uL (ref 4.22–5.81)
RDW: 13.3 % (ref 11.5–15.5)
WBC: 7 10*3/uL (ref 4.0–10.5)

## 2015-09-05 LAB — URINALYSIS, ROUTINE W REFLEX MICROSCOPIC
BILIRUBIN URINE: NEGATIVE
Glucose, UA: NEGATIVE mg/dL
Hgb urine dipstick: NEGATIVE
KETONES UR: 15 mg/dL — AB
NITRITE: NEGATIVE
PROTEIN: 30 mg/dL — AB
Specific Gravity, Urine: 1.034 — ABNORMAL HIGH (ref 1.005–1.030)
pH: 6.5 (ref 5.0–8.0)

## 2015-09-05 MED ORDER — METOCLOPRAMIDE HCL 5 MG/ML IJ SOLN
10.0000 mg | Freq: Once | INTRAMUSCULAR | Status: AC
  Administered 2015-09-05: 10 mg via INTRAVENOUS
  Filled 2015-09-05: qty 2

## 2015-09-05 MED ORDER — PANTOPRAZOLE SODIUM 40 MG IV SOLR
40.0000 mg | Freq: Once | INTRAVENOUS | Status: AC
  Administered 2015-09-05: 40 mg via INTRAVENOUS
  Filled 2015-09-05: qty 40

## 2015-09-05 MED ORDER — DIPHENHYDRAMINE HCL 50 MG/ML IJ SOLN
12.5000 mg | Freq: Once | INTRAMUSCULAR | Status: AC
  Administered 2015-09-05: 12.5 mg via INTRAVENOUS
  Filled 2015-09-05: qty 1

## 2015-09-05 MED ORDER — ONDANSETRON 4 MG PO TBDP
4.0000 mg | ORAL_TABLET | Freq: Once | ORAL | Status: DC | PRN
  Filled 2015-09-05: qty 1

## 2015-09-05 MED ORDER — SODIUM CHLORIDE 0.9 % IV BOLUS (SEPSIS)
1000.0000 mL | Freq: Once | INTRAVENOUS | Status: AC
  Administered 2015-09-05: 1000 mL via INTRAVENOUS

## 2015-09-05 MED ORDER — METOCLOPRAMIDE HCL 10 MG PO TABS: 10.0000 mg | ORAL_TABLET | Freq: Four times a day (QID) | ORAL | Status: DC

## 2015-09-05 NOTE — Discharge Instructions (Signed)

## 2015-09-05 NOTE — ED Provider Notes (Signed)
CSN: [id number]     Arrival date & time 09/05/15  0010 History   First MD Initiated Contact with Patient 09/05/15 0025     Chief Complaint  Patient presents with  . Emesis  . Abdominal Pain     (Consider location/radiation/quality/duration/timing/severity/associated sxs/prior Treatment) Patient is a 24 y.o. male presenting with vomiting and abdominal pain. The history is provided by the patient. No language interpreter was used.  Emesis Severity:  Mild Duration:  1 day Number of daily episodes:  2 Associated symptoms: abdominal pain   Associated symptoms: no chills   Associated symptoms comment:  Patient with complaint of nausea and vomiting today with bloody emesis. No fever. He complains of abdominal pain but reports chronic pain that is worse tonight. No known fever. He has had 2 episodes loose non-bloody stool. No SOB, cough, dysuria. He states he has had evaluation in the past for similar symptoms, including EGD performed by Dr. Ewing SchleinMagod in August of this year with diagnosis of only hiatal hernia, no ulcers. He does not take any medications daily.  Abdominal Pain Associated symptoms: nausea and vomiting   Associated symptoms: no chest pain, no chills, no fever and no shortness of breath     Past Medical History  Diagnosis Date  . Asthma    Past Surgical History  Procedure Laterality Date  . Esophagogastroduodenoscopy N/A 05/05/2015    Procedure: ESOPHAGOGASTRODUODENOSCOPY (EGD);  Surgeon: Vida RiggerMarc Magod, MD;  [facility];  Service: Endoscopy;  Laterality: N/A;   No family history on file. Social History  Substance Use Topics  . Smoking status: Former Smoker    Quit date: 12/13/2014  . Smokeless tobacco: Never Used  . Alcohol Use: No     Comment: 5 months    Review of Systems  Constitutional: Negative for fever and chills.  Respiratory: Negative.  Negative for shortness of breath.   Cardiovascular: Negative.  Negative for chest pain.  Gastrointestinal: Positive  for nausea, vomiting and abdominal pain. Negative for blood in stool.       Reports hematemesis.  Musculoskeletal: Negative.   Skin: Negative.   Neurological: Negative.  Negative for dizziness.      Allergies  Review of patient's allergies indicates no known allergies.  Home Medications   Prior to Admission medications   Medication Sig Start Date End Date Taking? Authorizing Provider  esomeprazole (NEXIUM) 40 MG capsule Take 1 capsule (40 mg total) by mouth daily. Patient not taking: Reported on 04/27/2015 02/05/15   Charlestine Nighthristopher Lawyer, PA-C  HYDROcodone-acetaminophen (NORCO/VICODIN) 5-325 MG per tablet Take 1-2 tablets by mouth every 4 (four) hours as needed for moderate pain or severe pain. Patient not taking: Reported on 04/27/2015 02/12/15   Trixie DredgeEmily West, PA-C  sucralfate (CARAFATE) 1 G tablet Take 1 tablet (1 g total) by mouth 4 (four) times daily -  with meals and at bedtime. Patient not taking: Reported on 04/27/2015 02/05/15   Charlestine Nighthristopher Lawyer, PA-C   BP 102/75 mmHg  Pulse 72  Temp(Src) 98.6 F (37 C) (Oral)  Resp 22  SpO2 100% Physical Exam  Constitutional: He is oriented to person, place, and time. He appears well-developed and well-nourished.  HENT:  Head: Normocephalic.  Neck: Normal range of motion. Neck supple.  Cardiovascular: Normal rate.   Pulmonary/Chest: Effort normal.  Abdominal: Soft. Bowel sounds are normal. There is tenderness. There is no rebound and no guarding.  Left upper and lower abdominal tenderness.   Musculoskeletal: Normal range of motion.  Neurological: He is alert and  oriented to person, place, and time.  Skin: Skin is warm and dry. No rash noted.  Psychiatric: He has a normal mood and affect.    ED Course  Procedures (including critical care time) Labs Review Labs Reviewed  LIPASE, BLOOD  COMPREHENSIVE METABOLIC PANEL  CBC  URINALYSIS, ROUTINE W REFLEX MICROSCOPIC (NOT AT [facility])   Results for orders placed or performed during the hospital  encounter of 09/05/15  Lipase, blood  Result Value Ref Range   Lipase 22 11 - 51 U/L  Comprehensive metabolic panel  Result Value Ref Range   Sodium 140 135 - 145 mmol/L   Potassium 3.4 (L) 3.5 - 5.1 mmol/L   Chloride 102 101 - 111 mmol/L   CO2 28 22 - 32 mmol/L   Glucose, Bld 99 65 - 99 mg/dL   BUN 14 6 - 20 mg/dL   Creatinine, Ser 2.53 0.61 - 1.24 mg/dL   Calcium 9.5 8.9 - 66.4 mg/dL   Total Protein 7.8 6.5 - 8.1 g/dL   Albumin 4.9 3.5 - 5.0 g/dL   AST 34 15 - 41 U/L   ALT 46 17 - 63 U/L   Alkaline Phosphatase 59 38 - 126 U/L   Total Bilirubin 1.0 0.3 - 1.2 mg/dL   GFR calc non Af Amer >60 >60 mL/min   GFR calc Af Amer >60 >60 mL/min   Anion gap 10 5 - 15  CBC  Result Value Ref Range   WBC 7.0 4.0 - 10.5 K/uL   RBC 5.59 4.22 - 5.81 MIL/uL   Hemoglobin 15.2 13.0 - 17.0 g/dL   HCT 40.3 47.4 - 25.9 %   MCV 80.9 78.0 - 100.0 fL   MCH 27.2 26.0 - 34.0 pg   MCHC 33.6 30.0 - 36.0 g/dL   RDW 56.3 87.5 - 64.3 %   Platelets 170 150 - 400 K/uL  Urinalysis, Routine w reflex microscopic (not at [facility])  Result Value Ref Range   Color, Urine AMBER (A) YELLOW   APPearance CLOUDY (A) CLEAR   Specific Gravity, Urine 1.034 (H) 1.005 - 1.030   pH 6.5 5.0 - 8.0   Glucose, UA NEGATIVE NEGATIVE mg/dL   Hgb urine dipstick NEGATIVE NEGATIVE   Bilirubin Urine NEGATIVE NEGATIVE   Ketones, ur 15 (A) NEGATIVE mg/dL   Protein, ur 30 (A) NEGATIVE mg/dL   Nitrite NEGATIVE NEGATIVE   Leukocytes, UA TRACE (A) NEGATIVE  Urine microscopic-add on  Result Value Ref Range   Squamous Epithelial / LPF 0-5 (A) NONE SEEN   WBC, UA 6-30 0 - 5 WBC/hpf   RBC / HPF 0-5 0 - 5 RBC/hpf   Bacteria, UA FEW (A) NONE SEEN   Crystals CA OXALATE CRYSTALS (A) NEGATIVE   Urine-Other MUCOUS PRESENT     Imaging Review No results found. I have personally reviewed and evaluated these images and lab results as part of my medical decision-making.   EKG Interpretation None      MDM   Final diagnoses:  None     1. Abdominal pain 2. N, V  The patient has a longstanding history of abdominal pain in LUQ, followed by GI. He complains of N, V new tonight, blood tinged.  Hgb stable. Symptoms resolved with medications. Tolerating PO's. CT ordered to evaluate for possible stone given abnormal urine, however, patient declined stating he felt much better and wanted to go home.     Elpidio Anis, PA-C 09/05/15 3295  Layla Maw Ward, DO 09/05/15 [phone]

## 2015-09-05 NOTE — ED Notes (Signed)
Patient aware that urine sample is needed.

## 2015-09-05 NOTE — ED Notes (Signed)
Pt c/o vomiting since an hour ago with bright red bleeding. Diarrhea since earlier today.  Abdominal pain 9/10 LUQ. Pt. States that "They put a scope down my throat and didn't find ulcers but they think I might have a hernia".

## 2015-09-21 ENCOUNTER — Encounter: Payer: Self-pay | Admitting: Internal Medicine

## 2015-09-21 ENCOUNTER — Ambulatory Visit (INDEPENDENT_AMBULATORY_CARE_PROVIDER_SITE_OTHER): Payer: Self-pay | Admitting: Internal Medicine

## 2015-09-21 VITALS — BP 108/76 | HR 66 | Ht 70.5 in | Wt 146.0 lb

## 2015-09-21 DIAGNOSIS — G8929 Other chronic pain: Secondary | ICD-10-CM | POA: Insufficient documentation

## 2015-09-21 DIAGNOSIS — R1033 Periumbilical pain: Secondary | ICD-10-CM

## 2015-09-21 DIAGNOSIS — R109 Unspecified abdominal pain: Secondary | ICD-10-CM

## 2015-09-21 MED ORDER — DICYCLOMINE HCL 20 MG PO TABS: ORAL_TABLET | ORAL | Status: DC

## 2015-09-21 NOTE — Progress Notes (Signed)
   Subjective:    Patient ID: Darryl Reyes, male    DOB: 1990-12-13, 25 y.o.   MRN: [id number]  HPI  Abdominal pain continues:  Pt took OMeprazole only for 1-2 weeks.  Stopped a week before his EGD with Dr. Ewing SchleinMagod on 05/05/2015  As he felt it was "binding him up"  Hard to get a clear idea if he is stating it constipated him.   EGD showed possibly a small hiatal hernia, but no ulcer or gastritis.   Pt. Also seen in ED 09/05/2015 with nausea and vomiting, possible hematemesis, though Hemoglobin quite good.   Pt. Describes pain or a "knot"  Above umbilicus and describes "gurgling" Does describe hard stools, though passes one every day.  Rarely has loose stools or diarrhea.  Did have diarrhea the day seen in ED on the 18th of December.   No meal in morning since April or May 2016.  Gets nauseated if eats in morning.  Really, generally eats [employer] [place] [employer] sandwich with fries and sweet tea every day and that's it.      Has had abdominal pain Sept 2015.  Started losing weight then as well.  Thinks he weighed about 175 lbs back then.  States he started eating 3 meals a day in 2014 when went to prison.  He worked out regularly and bulked up.  Before and after prison, ate only 2 meals daily until the spring of 2016 when couldn't tolerate breakfast any longer.   No melena or hematochezia. Does have a lot of gas--feels bloated frequently--generally a couple hours after eating. Does have a lot of stress--finding a job, etc., though he does not really seem to want to discuss more today.      Review of Systems     Objective:   Physical Exam Spacy appearing today.  Stares off and speaks slowly HEENT:  PERRL, EOMI, throat without injection, MMM Neck:  Supple, no adenopathy, no thyromegaly Chest:  CTA CV:  RRR with normal S1 and S2, No murmur or rub, radial pulses normal and equal. Abd:  S, +BS, No HSM or masses appreciated, tender in periumbilical area and LLQ, where has palpable stool.  No tenderness  in epigastrium today.  No rebound or peritoneal signs. Skin:  No rash or concerning lesion.       Assessment & Plan:  1.  Abdominal pain:  Mainly periumbilical and bilateral lower quadrants.  Check T-Transglutaminase.   Pt. 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  Will give a trial of antispasmodics and start fiber laxative, gradually titrate up on the latter. Recent CBC and CMP was normal  2.  PHQ-9 scored 15.  Pt. Feels all of this is due to how he feels with his abdominal discomfort currently.  Not interested in any counseling.  Using MJ every day throughout the day.  States it does increase his appetite, but gets abdominal discomfort when eats.

## 2015-09-21 NOTE — Patient Instructions (Signed)
TAke Dicyclomine about 20-30 minutes before you eat in the morning. Drink 6 - 8 glasses of water daily Get orange flavored Metamucil or Citrucel.  Mix in half of the recommended dose in 8 ounces of water and drink daily Stop going out to eat--start working on eating at home with your family at least once daily--try working on including the recommendations below:  Drink a glass of water before every meal Drink 6-8 glasses of water daily Eat three meals daily Eat a protein and healthy fat with every meal (eggs,fish, chicken, [place]key and limit red meats) Eat 5 servings of vegetables daily, mix the colors Eat 2 servings of fruit daily with skin, if skin is edible Use smaller plates Put food/utensils down as you chew and swallow each bite Eat at a table with friends/family at least once daily, no TV Do not eat in front of the TV  Let me know if you would like to speak to Darryl SimmerNatosha Reyes, our counselor

## 2015-09-22 LAB — TISSUE TRANSGLUTAMINASE, IGG: Tissue Transglut Ab: <2 U/mL (ref 0–5)

## 2015-10-22 ENCOUNTER — Ambulatory Visit: Payer: No Typology Code available for payment source | Admitting: Internal Medicine

## 2015-11-12 ENCOUNTER — Ambulatory Visit (INDEPENDENT_AMBULATORY_CARE_PROVIDER_SITE_OTHER): Payer: Self-pay | Admitting: Internal Medicine

## 2015-11-12 ENCOUNTER — Encounter: Payer: Self-pay | Admitting: Internal Medicine

## 2015-11-12 VITALS — BP 106/70 | HR 62 | Ht 70.5 in | Wt 145.0 lb

## 2015-11-12 DIAGNOSIS — R634 Abnormal weight loss: Secondary | ICD-10-CM

## 2015-11-12 DIAGNOSIS — R1033 Periumbilical pain: Secondary | ICD-10-CM

## 2015-11-12 DIAGNOSIS — G8929 Other chronic pain: Secondary | ICD-10-CM

## 2015-11-12 DIAGNOSIS — R109 Unspecified abdominal pain: Secondary | ICD-10-CM

## 2015-11-12 MED ORDER — DICYCLOMINE HCL 20 MG PO TABS: ORAL_TABLET | ORAL | Status: DC

## 2015-11-12 NOTE — Patient Instructions (Addendum)
Restart Dicyclomine  Before meals Add Citrucel caplets--to start with one daily and increase by 1 caplet weekly to max of 12 as needed to have regular BMs. Drink 8 glasses of water daily

## 2015-11-12 NOTE — Progress Notes (Signed)
   Subjective:    Patient ID: Darryl Reyes, male    DOB: 1991-03-16, 25 y.o.   MRN: [id number]  HPI   Abdominal Pain:  Has been taking dicyclomine 20 mg--was using 2 tabs twice daily before meals (sounds like he was given 10 mg Dicyclomine tabs) He feels it definitely helped with the abdominal pain and he was able to eat better.  Describes a healthier diet than before.   States after a couple weeks use of med, he felt "bound up"  And stopped using the medication.   Has tried the Citrucel powder, but the texture drinking it makes him gag, so stopped that as well. Generally, has more trouble with constipation, but can have diarrhea as well, which he has had today. Has had a CT of abdomen and pelvis 01/2015 and EGD 04/2015 without concerning findings.   Labs, including CBC, CMP, Tissue transglutaminase have been okay.  He states he is stressed and not dealing with son and other family members well.  Just wants people to leave him alone.  Feels that his GI issues are what is causing him problems.  Denies other drug usage, including heroin and oral opiates    Review of Systems     Objective:   Physical Exam  NAD Bradykinesia, smell strongly of MJ No track marks noted Lungs:  CTA CV: RRR without murmur or rub Abd:  S, Minimal tenderness in LLQ and midepigastrium.  No HSM or masses.       Assessment & Plan:  Suspect other drug use. 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  Abdominal complaints: To restart Dicyclomine before meals twice daily and add Citrucel caplets--to start with one daily and increase by 1 caplet weekly to max of 12 as needed to have regular BMs. Follow up in 1 month for weigh in and recheck. Warm hand off to Nilda Simmer, LCSW for counseling/case management. Pt. Took her card, but suspect will not follow up.

## 2015-11-12 NOTE — Addendum Note (Signed)
Addended by: Marcene Duos on: 11/12/2015 12:36 PM   Modules accepted: Orders

## 2015-11-13 LAB — TSH: TSH: 0.722 u[IU]/mL (ref 0.450–4.500)

## 2015-12-10 ENCOUNTER — Ambulatory Visit: Payer: No Typology Code available for payment source | Admitting: Internal Medicine

## 2017-10-23 ENCOUNTER — Ambulatory Visit (INDEPENDENT_AMBULATORY_CARE_PROVIDER_SITE_OTHER): Payer: Self-pay

## 2017-10-23 ENCOUNTER — Encounter (HOSPITAL_COMMUNITY): Payer: Self-pay | Admitting: Emergency Medicine

## 2017-10-23 ENCOUNTER — Ambulatory Visit (HOSPITAL_COMMUNITY)
Admission: EM | Admit: 2017-10-23 | Discharge: 2017-10-23 | Disposition: A | Discharge status: Home / Self Care | Payer: Self-pay | Attending: Emergency Medicine | Admitting: Emergency Medicine

## 2017-10-23 DIAGNOSIS — J45901 Unspecified asthma with (acute) exacerbation: Secondary | ICD-10-CM

## 2017-10-23 DIAGNOSIS — J111 Influenza due to unidentified influenza virus with other respiratory manifestations: Secondary | ICD-10-CM

## 2017-10-23 MED ORDER — ALBUTEROL SULFATE HFA 108 (90 BASE) MCG/ACT IN AERS: 1.0000 | INHALATION_SPRAY | Freq: Four times a day (QID) | RESPIRATORY_TRACT | 0 refills | Status: DC | PRN

## 2017-10-23 MED ORDER — ACETAMINOPHEN 325 MG PO TABS
ORAL_TABLET | ORAL | Status: AC
  Filled 2017-10-23: qty 3

## 2017-10-23 MED ORDER — PREDNISONE 10 MG (21) PO TBPK: ORAL_TABLET | ORAL | 0 refills | Status: DC

## 2017-10-23 MED ORDER — IBUPROFEN 800 MG PO TABS
ORAL_TABLET | ORAL | Status: AC
  Filled 2017-10-23: qty 1

## 2017-10-23 MED ORDER — AEROCHAMBER PLUS MISC: 2 refills | Status: AC

## 2017-10-23 MED ORDER — OSELTAMIVIR PHOSPHATE 75 MG PO CAPS: 75.0000 mg | ORAL_CAPSULE | Freq: Two times a day (BID) | ORAL | 0 refills | Status: DC

## 2017-10-23 MED ORDER — IBUPROFEN 800 MG PO TABS
800.0000 mg | ORAL_TABLET | Freq: Once | ORAL | Status: AC
  Administered 2017-10-23: 800 mg via ORAL

## 2017-10-23 MED ORDER — BENZONATATE 200 MG PO CAPS: 200.0000 mg | ORAL_CAPSULE | Freq: Three times a day (TID) | ORAL | 0 refills | Status: DC | PRN

## 2017-10-23 MED ORDER — FLUTICASONE PROPIONATE 50 MCG/ACT NA SUSP: 2.0000 | Freq: Every day | NASAL | 0 refills | Status: DC

## 2017-10-23 MED ORDER — PREDNISONE 20 MG PO TABS
60.0000 mg | ORAL_TABLET | Freq: Once | ORAL | Status: AC
  Administered 2017-10-23: 60 mg via ORAL

## 2017-10-23 MED ORDER — ACETAMINOPHEN 500 MG PO TABS
1000.0000 mg | ORAL_TABLET | Freq: Once | ORAL | Status: AC
  Administered 2017-10-23: 975 mg via ORAL

## 2017-10-23 MED ORDER — IPRATROPIUM-ALBUTEROL 0.5-2.5 (3) MG/3ML IN SOLN
RESPIRATORY_TRACT | Status: AC
  Filled 2017-10-23: qty 3

## 2017-10-23 MED ORDER — PREDNISONE 20 MG PO TABS
ORAL_TABLET | ORAL | Status: AC
  Filled 2017-10-23: qty 3

## 2017-10-23 MED ORDER — IPRATROPIUM-ALBUTEROL 0.5-2.5 (3) MG/3ML IN SOLN
3.0000 mL | Freq: Once | RESPIRATORY_TRACT | Status: AC
  Administered 2017-10-23: 3 mL via RESPIRATORY_TRACT

## 2017-10-23 NOTE — ED Triage Notes (Signed)
PT reports cough, chills, body aches, fever, vomiting that started yesterday.

## 2017-10-23 NOTE — ED Provider Notes (Signed)
HPI  SUBJECTIVE:   Darryl Reyes is a 27 y.o. male who presents with the acute onset of body aches, headaches, chills, nasal congestion, sharp, substernal chest pain, coughing, wheezing, shortness of breath.  Reports shortness of breath with exertion.  Reports nausea and 4-5 episodes of nonbilious, nonbloody emesis yesterday.  Denies diarrhea.  Reports anorexia.  States that he is tolerating p.o. today.  He denies sinus pain or pressure, ear pain, neck stiffness, rash.  No abdominal pain.  No sore throat, urinary complaints.  He tried cough drops with without improvement in symptoms, worse with walking.  States he becomes lightheaded with this.  He did not get a flu shot this year.  No contacts with flu.  No antipyretics in the past 6 or 8 hours.  He has a past medical history of asthma last admission was in childhood.  No intubations or recent steroids.  He does not have an albuterol inhaler.  No history of diabetes, hypertension.  PMD: None.  Past Medical History:  Diagnosis Date  . Asthma     Past Surgical History:  Procedure Laterality Date  . ESOPHAGOGASTRODUODENOSCOPY N/A 05/05/2015   Procedure: ESOPHAGOGASTRODUODENOSCOPY (EGD);  Surgeon: Vida Rigger, MD;  Location: [facility];  Service: Endoscopy;  Laterality: N/A;    History reviewed. No pertinent family history.  Social History   Tobacco Use  . Smoking status: Former Smoker    Packs/day: 1.00    Years: 7.00    Pack years: 7.00    Types: Cigarettes    Last attempt to quit: 12/13/2014    Years since quitting: 2.8  . Smokeless tobacco: Never Used  Substance Use Topics  . Alcohol use: No    Alcohol/week: 0.0 oz    Comment: Previous abuse  . Drug use: Yes    Types: Marijuana    Comment: Three times daily Marijuana.  No other drug use, past or present    No current facility-administered medications for this encounter.   Current Outpatient Medications:  .  albuterol (PROVENTIL HFA;VENTOLIN HFA) 108 (90 Base) MCG/ACT  inhaler, Inhale 1-2 puffs into the lungs every 6 (six) hours as needed for wheezing or shortness of breath., Disp: 1 Inhaler, Rfl: 0 .  benzonatate (TESSALON) 200 MG capsule, Take 1 capsule (200 mg total) by mouth 3 (three) times daily as needed for cough., Disp: 30 capsule, Rfl: 0 .  fluticasone (FLONASE) 50 MCG/ACT nasal spray, Place 2 sprays into both nostrils daily., Disp: 16 g, Rfl: 0 .  oseltamivir (TAMIFLU) 75 MG capsule, Take 1 capsule (75 mg total) by mouth 2 (two) times daily. X 5 days, Disp: 10 capsule, Rfl: 0 .  predniSONE (STERAPRED UNI-PAK 21 TAB) 10 MG (21) TBPK tablet, Dispense one 6 day pack. Take as directed with food., Disp: 21 tablet, Rfl: 0 .  Spacer/Aero-Holding Chambers (AEROCHAMBER PLUS) inhaler, Use as instructed, Disp: 1 each, Rfl: 2  Allergies  Allergen Reactions  . Metoclopramide Shortness Of Breath     ROS  As noted in HPI.   Physical Exam  BP 121/83 (BP Location: Left Arm)   Pulse (!) 101   Temp 100.1 F (37.8 C) (Oral)   Resp (!) 24   SpO2 96%   Constitutional: Well developed, well nourished, appears ill.  Speaking in full sentences. Eyes:  EOMI, conjunctiva normal bilaterally HENT: Normocephalic, atraumatic,mucus membranes moist.  Positive nasal congestion.  No sinus tenderness.  TMs normal bilaterally.  Normal oropharynx.  Positive cobblestoning with postnasal drip. Neck: No cervical lymphadenopathy,  meningismus Respiratory: Poor inspiratory effort.  Poor air movement.  Lungs clear bilaterally.  Diffuse chest wall tenderness Cardiovascular: Normal rate and rhythm, no murmurs, rubs, gallops GI: nondistended, soft, nontender. Back: No CVAT. skin: No rash, skin intact Musculoskeletal: no deformities Neurologic: Alert & oriented x 3, no focal neuro deficits Psychiatric: Speech and behavior appropriate   ED Course   Medications  ibuprofen (ADVIL,MOTRIN) tablet 800 mg (800 mg Oral Given 10/23/17 1833)  ipratropium-albuterol (DUONEB) 0.5-2.5 (3)  MG/3ML nebulizer solution 3 mL (3 mLs Nebulization Given 10/23/17 1857)  acetaminophen (TYLENOL) tablet 1,000 mg (975 mg Oral Given 10/23/17 1855)  predniSONE (DELTASONE) tablet 60 mg (60 mg Oral Given 10/23/17 1855)    Orders Placed This Encounter  Procedures  . DG Chest 2 View    Standing Status:   Standing    Number of Occurrences:   1    Order Specific Question:   Reason for Exam (SYMPTOM  OR DIAGNOSIS REQUIRED)    Answer:   r/o PNA  . Recheck vitals    30 min after IBU    Standing Status:   Standing    Number of Occurrences:   1    No results found for this or any previous visit (from the past 24 hour(s)). Dg Chest 2 View  Result Date: 10/23/2017 CLINICAL DATA:  Cough dry, chills, body aches, fever, nausea, HA, vomiting that started yesterday. Pt appears to be gasping when breathing. Pt sts that its difficulty breathing like airways are closing up, also painful inhalation. Pain described as sharp in central sternal area and posterior left scapular area. Hx of asthma. No known heart conditions, nonsmoker, nondiabetic. Provider r/o PNA. EXAM: CHEST  2 VIEW COMPARISON:  None. FINDINGS: The heart size and mediastinal contours are within normal limits. Both lungs are clear. No pleural effusion or pneumothorax. The visualized skeletal structures are unremarkable. IMPRESSION: Normal chest radiographs. Electronically Signed   By: Amie Portlandavid  Ormond M.D.   On: 10/23/2017 18:40    ED Clinical Impression  Influenza  Asthma with acute exacerbation, unspecified asthma severity, unspecified whether persistent   ED Assessment/Plan  Presentation consistent with influenza with an asthma exacerbation.  Checking chest x-ray to rule out pneumonia.  Giving 800 mg ibuprofen with 1 g of Tylenol, DuoNeb, 60 mg of prednisone orally.  Will reevaluate.  Reviewed imaging independently.  No pneumonia.  See radiology report for full details.  On reevaluation, patient states that he feels better.  States breathing  is better.  Improved air movement.  Lungs still clear to auscultation.  Fever trending down prior to discharge.  Plan to send home with Tamiflu, and albuterol inhaler with a spacer, Flonase, ibuprofen 600 mg to take with 1 g of Tylenol 3-4 times a day as needed.  Will provide a primary care list and order a primary care referral for ongoing management of his asthma.  Discussed imaging, MDM, plan and followup with patient. Discussed sn/sx that should prompt return to the ED. patient agrees with plan.   Meds ordered this encounter  Medications  . ibuprofen (ADVIL,MOTRIN) tablet 800 mg  . ipratropium-albuterol (DUONEB) 0.5-2.5 (3) MG/3ML nebulizer solution 3 mL  . acetaminophen (TYLENOL) tablet 1,000 mg  . predniSONE (DELTASONE) tablet 60 mg  . Spacer/Aero-Holding Chambers (AEROCHAMBER PLUS) inhaler    Sig: Use as instructed    Dispense:  1 each    Refill:  2  . albuterol (PROVENTIL HFA;VENTOLIN HFA) 108 (90 Base) MCG/ACT inhaler    Sig: Inhale 1-2 puffs  into the lungs every 6 (six) hours as needed for wheezing or shortness of breath.    Dispense:  1 Inhaler    Refill:  0  . fluticasone (FLONASE) 50 MCG/ACT nasal spray    Sig: Place 2 sprays into both nostrils daily.    Dispense:  16 g    Refill:  0  . oseltamivir (TAMIFLU) 75 MG capsule    Sig: Take 1 capsule (75 mg total) by mouth 2 (two) times daily. X 5 days    Dispense:  10 capsule    Refill:  0  . benzonatate (TESSALON) 200 MG capsule    Sig: Take 1 capsule (200 mg total) by mouth 3 (three) times daily as needed for cough.    Dispense:  30 capsule    Refill:  0  . predniSONE (STERAPRED UNI-PAK 21 TAB) 10 MG (21) TBPK tablet    Sig: Dispense one 6 day pack. Take as directed with food.    Dispense:  21 tablet    Refill:  0    *This clinic note was created using Scientist, clinical (histocompatibility and immunogenetics). Therefore, there may be occasional mistakes despite careful proofreading.   ?   Domenick Gong, MD 10/23/17 2145

## 2017-10-23 NOTE — Discharge Instructions (Signed)
Take two puffs from your albuterol inhaler every 4 hours. Finish the steroids unless your doctor tells you to stop. Finish the Tamiflu, even if you feel better. You may decrease the frequency of your albuterol inhaler as you start feeling better. You may start the steroids tomorrow, as you have already taken today's dose. You may take tylenol 1 gram up to 4 times a day as needed for pain. This with 600 mg of motrin is an effective combination for pain and fever. Make sure you drink extra fluids. Return to the ER if you get worse, have a persistent fever >100.4, or any other concerns.   Below is a list of primary care practices who are taking new patients for you to follow-up with. [facility] [address] [place], [place] [address] [phone]  [facility] [phone] [address] [place] [address]  [facility]: [address] [place] [address]  [phone]  [facility]: [address] [place] [address]   [phone]  [facility]: [address] [place] [address]  [phone]  [facility] : [address] [place] [address] [phone]  [facility]: [address] [place] [phone]  [facility]: [address] [place] [address] [phone]  Dr. Oneal GroutMahima Pandey [address] [facility] [address] [facility] [place] [id number]  [phone]  Dr. Jackie PlumGeorge Osei-Bonsu, [facility]. [address] [place], [place] [address]  [phone]  Go to www.goodrx.com to look up your medications. This will give you a list of where you can find your prescriptions at the most affordable prices. Or ask the pharmacist what the cash price is, or  if they have any other discount programs available to help make your medication more affordable. This can be less expensive than what you would pay with insurance.

## 2017-12-10 ENCOUNTER — Encounter: Payer: Self-pay | Admitting: Gastroenterology

## 2018-01-13 ENCOUNTER — Encounter (HOSPITAL_COMMUNITY): Payer: Self-pay | Admitting: Emergency Medicine

## 2018-01-13 ENCOUNTER — Emergency Department (HOSPITAL_COMMUNITY)
Admission: EM | Admit: 2018-01-13 | Discharge: 2018-01-13 | Disposition: A | Discharge status: Home / Self Care | Payer: Self-pay | Attending: Emergency Medicine | Admitting: Emergency Medicine

## 2018-01-13 ENCOUNTER — Emergency Department (HOSPITAL_COMMUNITY): Payer: Self-pay

## 2018-01-13 ENCOUNTER — Other Ambulatory Visit: Payer: Self-pay

## 2018-01-13 DIAGNOSIS — Z87891 Personal history of nicotine dependence: Secondary | ICD-10-CM | POA: Insufficient documentation

## 2018-01-13 DIAGNOSIS — J45909 Unspecified asthma, uncomplicated: Secondary | ICD-10-CM | POA: Insufficient documentation

## 2018-01-13 DIAGNOSIS — F1210 Cannabis abuse, uncomplicated: Secondary | ICD-10-CM | POA: Insufficient documentation

## 2018-01-13 DIAGNOSIS — R2242 Localized swelling, mass and lump, left lower limb: Secondary | ICD-10-CM | POA: Insufficient documentation

## 2018-01-13 DIAGNOSIS — M25572 Pain in left ankle and joints of left foot: Secondary | ICD-10-CM | POA: Insufficient documentation

## 2018-01-13 DIAGNOSIS — Z79899 Other long term (current) drug therapy: Secondary | ICD-10-CM | POA: Insufficient documentation

## 2018-01-13 MED ORDER — MELOXICAM 15 MG PO TABS: 15.0000 mg | ORAL_TABLET | Freq: Every day | ORAL | 0 refills | Status: DC

## 2018-01-13 NOTE — ED Triage Notes (Signed)
Patient here with left ankle pain.  He states that he has injured this ankle years ago.  He states that he played basketball yesterday and has had increasing pain since.  He states that he having a hard time walking on it.

## 2018-01-13 NOTE — ED Provider Notes (Signed)
[facility] EMERGENCY DEPARTMENT Provider Note   CSN: [id number] Arrival date & time: 01/13/18  0551     History   Chief Complaint Chief Complaint  Patient presents with  . Ankle Pain    HPI Darryl Reyes is a 27 y.o. male who presents with chief complaint of left ankle pain.  Patient states that he has had multiple left ankle injuries over the past several years.  He states that he played basketball about 5 days ago and after playing noticed some pain and swelling.  Yesterday he went out to play again and was only able to shoot about 2 baskets before the pain in his ankle became too much for him to bear weight on the ankle.  Since that time he has had severe pain with movement, and minor swelling which is worse around the lateral malleolus.  His pain is worsened with active movement of the ankle but also constant even at rest.  He has not taken anything for the ankle.  HPI  Past Medical History:  Diagnosis Date  . Asthma     Patient Active Problem List   Diagnosis Date Noted  . Chronic abdominal pain 09/21/2015    Past Surgical History:  Procedure Laterality Date  . ESOPHAGOGASTRODUODENOSCOPY N/A 05/05/2015   Procedure: ESOPHAGOGASTRODUODENOSCOPY (EGD);  Surgeon: Vida Rigger, MD;  Location: [facility];  Service: Endoscopy;  Laterality: N/A;        Home Medications    Prior to Admission medications   Medication Sig Start Date End Date Taking? Authorizing Provider  albuterol (PROVENTIL HFA;VENTOLIN HFA) 108 (90 Base) MCG/ACT inhaler Inhale 1-2 puffs into the lungs every 6 (six) hours as needed for wheezing or shortness of breath. 10/23/17   Domenick Gong, MD  benzonatate (TESSALON) 200 MG capsule Take 1 capsule (200 mg total) by mouth 3 (three) times daily as needed for cough. 10/23/17   Domenick Gong, MD  fluticasone (FLONASE) 50 MCG/ACT nasal spray Place 2 sprays into both nostrils daily. 10/23/17   Domenick Gong, MD  oseltamivir  (TAMIFLU) 75 MG capsule Take 1 capsule (75 mg total) by mouth 2 (two) times daily. X 5 days 10/23/17   Domenick Gong, MD  predniSONE (STERAPRED UNI-PAK 21 TAB) 10 MG (21) TBPK tablet Dispense one 6 day pack. Take as directed with food. 10/23/17   Domenick Gong, MD  Spacer/Aero-Holding Chambers (AEROCHAMBER PLUS) inhaler Use as instructed 10/23/17   Domenick Gong, MD    Family History No family history on file.  Social History Social History   Tobacco Use  . Smoking status: Former Smoker    Packs/day: 1.00    Years: 7.00    Pack years: 7.00    Types: Cigarettes    Last attempt to quit: 12/13/2014    Years since quitting: 3.0  . Smokeless tobacco: Never Used  Substance Use Topics  . Alcohol use: No    Alcohol/week: 0.0 oz    Comment: Previous abuse  . Drug use: Yes    Types: Marijuana    Comment: Three times daily Marijuana.  No other drug use, past or present     Allergies   Metoclopramide   Review of Systems Review of Systems  Positive for ankle pain and swelling, negative for calf tenderness, chest pain, shortness of breath  Physical Exam Updated Vital Signs BP 111/69 (BP Location: Right Arm)   Pulse (!) 52   Temp 97.8 F (36.6 C) (Oral)   Resp 18   SpO2 99%  Physical Exam  Constitutional: He appears well-developed and well-nourished. No distress.  HENT:  Head: Normocephalic and atraumatic.  Eyes: Conjunctivae are normal. No scleral icterus.  Neck: Normal range of motion. Neck supple.  Cardiovascular: Normal rate, regular rhythm and normal heart sounds.  Pulmonary/Chest: Effort normal and breath sounds normal. No respiratory distress.  Abdominal: Soft. There is no tenderness.  Musculoskeletal: He exhibits no edema.  Pain with passive and active range of motion of the left ankle, swelling over the lateral malleolus and insertion point of the deltoid ligament.  Ligaments are stable, normal pulse and sensation  Neurological: He is alert.  Skin: Skin is  warm and dry. He is not diaphoretic.  Psychiatric: His behavior is normal.  Nursing note and vitals reviewed.    ED Treatments / Results  Labs (all labs ordered are listed, but only abnormal results are displayed) Labs Reviewed - No data to display  EKG None  Radiology Dg Ankle Complete Left  Result Date: 01/13/2018 CLINICAL DATA:  Initial evaluation for acute pain for 1 week, swelling. EXAM: LEFT ANKLE COMPLETE - 3+ VIEW COMPARISON:  None. FINDINGS: No acute fracture dislocation. Ankle mortise approximated. Talar dome intact. No acute soft tissue abnormality. IMPRESSION: No acute osseous abnormality about the left ankle. Electronically Signed   By: Rise Mu M.D.   On: 01/13/2018 06:59   Dg Foot Complete Left  Result Date: 01/13/2018 CLINICAL DATA:  Initial evaluation for acute pain and swelling for 1 week. EXAM: LEFT FOOT - COMPLETE 3+ VIEW COMPARISON:  None. FINDINGS: There is no evidence of fracture or dislocation. There is no evidence of arthropathy or other focal bone abnormality. Soft tissues are unremarkable. IMPRESSION: Negative. Electronically Signed   By: Rise Mu M.D.   On: 01/13/2018 06:59    Procedures Procedures (including critical care time)  Medications Ordered in ED Medications - No data to display   Initial Impression / Assessment and Plan / ED Course  I have reviewed the triage vital signs and the nursing notes.  Pertinent labs & imaging results that were available during my care of the patient were reviewed by me and considered in my medical decision making (see chart for details).     Patient with either ankle sprain or tendinitis.  Patient will be placed in cam walker for support, otherwise supportive care and outpatient follow-up, calf compartments are soft and nontender no suggestion of DVT.  He appears appropriate for discharge at this time  Final Clinical Impressions(s) / ED Diagnoses   Final diagnoses:  Acute left ankle  pain    ED Discharge Orders    None       Arthor Captain, PA-C 01/13/18 1027    Shaune Pollack, MD 01/14/18 [phone]

## 2018-01-13 NOTE — Progress Notes (Signed)
Orthopedic Tech Progress Note Patient Details:  Darryl Reyes [birth year] [id number]  Ortho Devices Type of Ortho Device: CAM walker Ortho Device/Splint Location: lle Ortho Device/Splint Interventions: Application   Post Interventions Patient Tolerated: Well Instructions Provided: Care of device   [name] 01/13/2018, 11:30 AM

## 2018-01-13 NOTE — Discharge Instructions (Addendum)
Apply ice to the affected ankle for 20 minutes at a time 4-5 times a day.  Elevate your ankle above your heart.  Wear the cam walker boot for at least the next 2 weeks and then you can transition to the ankle brace to walk previously as long as you are able to bear weight.  If you continue to have severe pain in the ankle please follow closely with the orthopedic physician or your primary care doctor. Patient to take an anti-inflammatory medication with food daily for the next 20 days. Get help right away if: Your foot, leg, toes, or ankle tingles or becomes numb. Your foot, leg, toes, or ankle becomes swollen. Your foot, leg, toes, or ankle turns pale or blue.

## 2018-01-13 NOTE — ED Notes (Signed)
Patient able to ambulate independently  

## 2018-01-13 NOTE — Progress Notes (Signed)
Orthopedic Tech Progress Note Patient Details:  Darryl Reyes 1991-08-06 [id number]  Ortho Devices Type of Ortho Device: CAM walker Ortho Device/Splint Location: lle Ortho Device/Splint Interventions: Application   Post Interventions Patient Tolerated: Well Instructions Provided: Care of device   [name] 01/13/2018, 11:29 AM

## 2018-01-13 NOTE — ED Notes (Signed)
Ortho tech paged  

## 2018-01-24 ENCOUNTER — Other Ambulatory Visit (INDEPENDENT_AMBULATORY_CARE_PROVIDER_SITE_OTHER): Payer: Self-pay

## 2018-01-24 ENCOUNTER — Ambulatory Visit (INDEPENDENT_AMBULATORY_CARE_PROVIDER_SITE_OTHER): Payer: Self-pay | Admitting: Gastroenterology

## 2018-01-24 ENCOUNTER — Encounter: Payer: Self-pay | Admitting: Gastroenterology

## 2018-01-24 VITALS — BP 90/60 | HR 52 | Ht 72.0 in | Wt 167.8 lb

## 2018-01-24 DIAGNOSIS — K625 Hemorrhage of anus and rectum: Secondary | ICD-10-CM

## 2018-01-24 DIAGNOSIS — G8929 Other chronic pain: Secondary | ICD-10-CM

## 2018-01-24 DIAGNOSIS — R109 Unspecified abdominal pain: Secondary | ICD-10-CM

## 2018-01-24 LAB — CBC WITH DIFFERENTIAL/PLATELET
BASOS PCT: 0.8 % (ref 0.0–3.0)
Basophils Absolute: 0 10*3/uL (ref 0.0–0.1)
EOS ABS: 0.3 10*3/uL (ref 0.0–0.7)
EOS PCT: 7.8 % — AB (ref 0.0–5.0)
HEMATOCRIT: 43.5 % (ref 39.0–52.0)
HEMOGLOBIN: 14.4 g/dL (ref 13.0–17.0)
Lymphocytes Relative: 55.4 % — ABNORMAL HIGH (ref 12.0–46.0)
Lymphs Abs: 2 10*3/uL (ref 0.7–4.0)
MCHC: 33.2 g/dL (ref 30.0–36.0)
MCV: 81.7 fl (ref 78.0–100.0)
MONOS PCT: 6.3 % (ref 3.0–12.0)
Monocytes Absolute: 0.2 10*3/uL (ref 0.1–1.0)
Neutro Abs: 1.1 10*3/uL — ABNORMAL LOW (ref 1.4–7.7)
Neutrophils Relative %: 29.7 % — ABNORMAL LOW (ref 43.0–77.0)
Platelets: 195 10*3/uL (ref 150.0–400.0)
RBC: 5.32 Mil/uL (ref 4.22–5.81)
RDW: 14.2 % (ref 11.5–15.5)
WBC: 3.7 10*3/uL — AB (ref 4.0–10.5)

## 2018-01-24 LAB — COMPREHENSIVE METABOLIC PANEL
ALBUMIN: 4.3 g/dL (ref 3.5–5.2)
ALT: 14 U/L (ref 0–53)
AST: 17 U/L (ref 0–37)
Alkaline Phosphatase: 55 U/L (ref 39–117)
BUN: 14 mg/dL (ref 6–23)
CALCIUM: 9.3 mg/dL (ref 8.4–10.5)
CHLORIDE: 105 meq/L (ref 96–112)
CO2: 28 mEq/L (ref 19–32)
CREATININE: 0.93 mg/dL (ref 0.40–1.50)
GFR: 123.49 mL/min (ref >60.00)
Glucose, Bld: 84 mg/dL (ref 70–99)
POTASSIUM: 4.3 meq/L (ref 3.5–5.1)
Sodium: 138 mEq/L (ref 135–145)
Total Bilirubin: 0.5 mg/dL (ref 0.2–1.2)
Total Protein: 7.1 g/dL (ref 6.0–8.3)

## 2018-01-24 MED ORDER — DICYCLOMINE HCL 10 MG PO CAPS: 10.0000 mg | ORAL_CAPSULE | Freq: Three times a day (TID) | ORAL | 1 refills | Status: DC | PRN

## 2018-01-24 MED ORDER — OMEPRAZOLE 20 MG PO CPDR: 20.0000 mg | DELAYED_RELEASE_CAPSULE | Freq: Two times a day (BID) | ORAL | 3 refills | Status: DC

## 2018-01-24 NOTE — Patient Instructions (Addendum)
If you are age 29 or older, your body mass index should be between 23-30. Your Body mass index is 22.76 kg/m. If this is out of the aforementioned range listed, please consider follow up with your Primary Care Provider.  If you are age 47 or younger, your body mass index should be between 19-25. Your Body mass index is 22.76 kg/m. If this is out of the aformentioned range listed, please consider follow up with your Primary Care Provider.   It has been recommended to you by your physician that you have a(n) colonoscopy completed. Per your request, we did not schedule the procedure(s) today. Please contact our office at [phone] should you decide to have the procedure completed. We can provide you with a Free colon prep at that time.  We are giving you a form to apply for the [facility] card today.  We have sent the following medications to your pharmacy for you to pick up at your convenience: Omeprazole: Take twice a day Bentyl: Take every 8 hours as needed  Please go to the lab in the basement of our building to have lab work done as you leave today.  Thank you for entrusting me with your care and for choosing [facility], Dr. Ileene Patrick
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
## 2018-01-30 ENCOUNTER — Other Ambulatory Visit: Payer: Self-pay

## 2018-01-30 DIAGNOSIS — G8929 Other chronic pain: Secondary | ICD-10-CM

## 2018-01-30 DIAGNOSIS — K625 Hemorrhage of anus and rectum: Secondary | ICD-10-CM

## 2018-01-30 DIAGNOSIS — R109 Unspecified abdominal pain: Principal | ICD-10-CM

## 2018-02-01 LAB — HELICOBACTER PYLORI  SPECIAL ANTIGEN
MICRO NUMBER: 90591839
SPECIMEN QUALITY: ADEQUATE

## 2018-02-06 ENCOUNTER — Other Ambulatory Visit: Payer: Self-pay

## 2018-02-06 MED ORDER — DICYCLOMINE HCL 10 MG PO CAPS: 10.0000 mg | ORAL_CAPSULE | Freq: Three times a day (TID) | ORAL | 1 refills | Status: AC | PRN

## 2018-02-06 MED ORDER — OMEPRAZOLE 20 MG PO CPDR: 20.0000 mg | DELAYED_RELEASE_CAPSULE | Freq: Two times a day (BID) | ORAL | 3 refills | Status: DC

## 2018-02-06 NOTE — Progress Notes (Signed)
Pt cannot get meds through the [facility] without an [facility] card. He asked me to send scripts to [facility] on [address]. Scripts sent

## 2018-03-20 ENCOUNTER — Encounter (HOSPITAL_COMMUNITY): Payer: Self-pay | Admitting: Emergency Medicine

## 2018-03-20 ENCOUNTER — Ambulatory Visit (INDEPENDENT_AMBULATORY_CARE_PROVIDER_SITE_OTHER): Payer: Self-pay

## 2018-03-20 ENCOUNTER — Ambulatory Visit (HOSPITAL_COMMUNITY)
Admission: EM | Admit: 2018-03-20 | Discharge: 2018-03-20 | Disposition: A | Discharge status: Home / Self Care | Payer: Self-pay | Attending: Internal Medicine | Admitting: Internal Medicine

## 2018-03-20 DIAGNOSIS — S39012A Strain of muscle, fascia and tendon of lower back, initial encounter: Secondary | ICD-10-CM

## 2018-03-20 MED ORDER — CYCLOBENZAPRINE HCL 10 MG PO TABS: 10.0000 mg | ORAL_TABLET | Freq: Every evening | ORAL | 0 refills | Status: DC | PRN
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
## 2018-03-20 NOTE — Discharge Instructions (Signed)
Musculoskeletal strain. No red flag symptoms. Emphasized remaining active. Advised NSAIDs and stretching.

## 2018-05-24 ENCOUNTER — Encounter: Payer: Self-pay | Admitting: Internal Medicine

## 2018-05-24 ENCOUNTER — Ambulatory Visit: Payer: Self-pay | Admitting: Internal Medicine

## 2018-05-24 VITALS — BP 102/78 | HR 70 | Resp 12 | Ht 70.5 in | Wt 154.0 lb

## 2018-05-24 DIAGNOSIS — F329 Major depressive disorder, single episode, unspecified: Secondary | ICD-10-CM

## 2018-05-24 DIAGNOSIS — G8929 Other chronic pain: Secondary | ICD-10-CM

## 2018-05-24 DIAGNOSIS — R109 Unspecified abdominal pain: Secondary | ICD-10-CM

## 2018-05-24 DIAGNOSIS — F419 Anxiety disorder, unspecified: Secondary | ICD-10-CM

## 2018-05-24 MED ORDER — SERTRALINE HCL 50 MG PO TABS: ORAL_TABLET | ORAL | 2 refills | Status: DC

## 2018-05-24 NOTE — Progress Notes (Signed)
   Subjective:    Patient ID: Darryl Reyes, male    DOB: 01-12-1991, 27 y.o.   MRN: [id number]  HPI   Here to re establish after 2 years. [facility] after last visit until 2018 for something outstanding.  1.  Chronic Abdominal Pain:  Has been a problem since 2015.  See previous notes.   Normal CT scans of abdomen and pelvis in the past  EGD normal Transglutaminase negative. H pylori, stool, more recently normal 01/30/18 Was to have colonoscopy with [facility], but states he was to get orange card first and then get referred.  This for intermittent blood in stool.   Down to 154 lb now.  Was 167 lb in May.  States was 162 1 week ago.  When last seen in 2017, he was doing better with antispasmodics and fiber laxative.   He was prescribed Omeprazole 20 mg twice daily and Bentyl 10 mg every 8 hours as needed with GI, Dr. Adela Lank, but he is only using the Bentyl as needed currently.  He feels it may help a little.    Pain is midepigastrium and maybe a bit to right.  Often radiates to right above umbilicus.  Sometimes radiates to back.   Has poor appetite and vomits when having the pain. Has lost weight as above in past and more recently with this. He has alternating constipation and diarrhea.   Sometimes pain is better after having a BM.   Denies a lot of gas/bloating. "feels like I'm starving" No melena.  If strains, can have blood on outside of stool or on tissue.   Smoking MJ 2-3 times daily.  Uses for stress.  He is going to trial in November for attempted murder.    He is currently living with the mother of his son and son.    He scored 15 in 2017 with PHQ9 as well as today.       Review of Systems     Objective:   Physical Exam Becomes tearful--admits to thinking about harming himself at times Lungs:  CTA CV:  RRR, bradycardic, radial pulses normal and equal Abd:  S, mild epigastric tenderness.  + BS, No HSM or mass.  No rebound or peritoneal  signs.        Assessment & Plan:  1.  Depression and anxiety and suspect IBS:  Start Zoloft 25 mg daily and increase to 50 mg daily after 7 days. To call if thinking of harming self/suicide and be seen immediately or go to ED.  He agrees. Has appt with Samul Dada, LCSW in 1 week as well.  2.  Chronic abdominal pain:  Suspect IBS.  Under severe stress.  With intermittent blood in stool however, referral to GI,Dr Armbruster,  for the colonoscopy as recommended. Zoloft as above. Antispasmodics as above Also to restart Citrucel or Metamucil daily.  Follow up with me in 1 week.

## 2018-05-31 ENCOUNTER — Ambulatory Visit (INDEPENDENT_AMBULATORY_CARE_PROVIDER_SITE_OTHER): Payer: Self-pay | Admitting: Licensed Clinical Social Worker

## 2018-05-31 ENCOUNTER — Encounter: Payer: Self-pay | Admitting: Internal Medicine

## 2018-05-31 ENCOUNTER — Ambulatory Visit: Payer: Self-pay | Admitting: Internal Medicine

## 2018-05-31 VITALS — BP 110/78 | HR 64 | Resp 12 | Ht 70.5 in | Wt 151.0 lb

## 2018-05-31 DIAGNOSIS — F419 Anxiety disorder, unspecified: Secondary | ICD-10-CM

## 2018-05-31 DIAGNOSIS — F439 Reaction to severe stress, unspecified: Secondary | ICD-10-CM

## 2018-05-31 DIAGNOSIS — G8929 Other chronic pain: Secondary | ICD-10-CM

## 2018-05-31 DIAGNOSIS — F32A Depression, unspecified: Secondary | ICD-10-CM

## 2018-05-31 DIAGNOSIS — R109 Unspecified abdominal pain: Secondary | ICD-10-CM

## 2018-05-31 DIAGNOSIS — F329 Major depressive disorder, single episode, unspecified: Secondary | ICD-10-CM

## 2018-05-31 NOTE — Progress Notes (Signed)
   Subjective:    Patient ID: Darryl Reyes, male    DOB: November 25, 1990, 27 y.o.   MRN: [id number]  HPI   Follow up for depression/IBS to be certain tolerating Sertraline.  Feels a bit tired since start of Sertraline, but willing to continue. No suicidal ideation since starting.  Current Meds  Medication Sig  . albuterol (PROVENTIL HFA;VENTOLIN HFA) 108 (90 Base) MCG/ACT inhaler Inhale 1-2 puffs into the lungs every 6 (six) hours as needed for wheezing or shortness of breath.  . dicyclomine (BENTYL) 10 MG capsule Take 1 capsule (10 mg total) by mouth every 8 (eight) hours as needed for spasms.  Marland Kitchen. sertraline (ZOLOFT) 50 MG tablet 1/2 tab by mouth daily for 7 days, then increase to 1 tab daily  . Spacer/Aero-Holding Chambers (AEROCHAMBER PLUS) inhaler Use as instructed    Allergies  Allergen Reactions  . Metoclopramide Shortness Of Breath       Review of Systems     Objective:   Physical Exam  Appears more alert today than he has in past.      Assessment & Plan:  Depression/IBS:  To increase Sertraline to 50 mg daily.  Call if problems.  Follow up in 4 weeks.
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
## 2018-06-12 ENCOUNTER — Other Ambulatory Visit: Payer: Self-pay | Admitting: Licensed Clinical Social Worker

## 2018-06-27 ENCOUNTER — Encounter: Payer: Self-pay | Admitting: Internal Medicine

## 2018-06-27 DIAGNOSIS — F329 Major depressive disorder, single episode, unspecified: Secondary | ICD-10-CM | POA: Insufficient documentation

## 2018-06-27 DIAGNOSIS — F419 Anxiety disorder, unspecified: Principal | ICD-10-CM

## 2018-06-27 DIAGNOSIS — F32A Depression, unspecified: Secondary | ICD-10-CM | POA: Insufficient documentation

## 2018-06-28 ENCOUNTER — Ambulatory Visit: Payer: Self-pay | Admitting: Internal Medicine

## 2018-07-10 ENCOUNTER — Ambulatory Visit: Payer: Self-pay | Admitting: Gastroenterology

## 2018-07-10 NOTE — Progress Notes (Signed)
No show letter sent.

## 2018-08-09 ENCOUNTER — Encounter: Payer: Self-pay | Admitting: Internal Medicine

## 2018-08-09 ENCOUNTER — Ambulatory Visit: Payer: Self-pay | Admitting: Internal Medicine

## 2018-08-09 VITALS — BP 120/78 | HR 80 | Resp 12 | Ht 70.5 in | Wt 160.5 lb

## 2018-08-09 DIAGNOSIS — F419 Anxiety disorder, unspecified: Principal | ICD-10-CM

## 2018-08-09 DIAGNOSIS — F329 Major depressive disorder, single episode, unspecified: Secondary | ICD-10-CM

## 2018-08-09 DIAGNOSIS — K589 Irritable bowel syndrome without diarrhea: Secondary | ICD-10-CM

## 2018-08-09 MED ORDER — SERTRALINE HCL 50 MG PO TABS: ORAL_TABLET | ORAL | 11 refills | Status: AC

## 2018-08-09 NOTE — Progress Notes (Signed)
   Subjective:   Patient ID: Darryl Reyes, male    DOB: 15-Feb-1991, 27 y.o.   MRN: [id number]  HPI  Depression/IBS:  Taking Sertraline 50 mg daily.  Was off for a couple weeks during his about 6 week stay in jail/prison.  Jail was reason he missed last appt Abdominal discomfort has improved.    Current Meds  Medication Sig  . sertraline (ZOLOFT) 50 MG tablet 1 tab by mouth daily  . [DISCONTINUED] sertraline (ZOLOFT) 50 MG tablet 1/2 tab by mouth daily for 7 days, then increase to 1 tab daily   Allergies  Allergen Reactions  . Metoclopramide Shortness Of Breath    Review of Systems    Objective:  Physical Exam NAD Lungs:  CTA CV:  RRR without murmur or rub.  Radial pulses normal and equal      Assessment & Plan:  Depression/anxiety/IBS:  Improved on Sertraline.  Refilled.  Followup in 1 month

## 2018-08-20 ENCOUNTER — Encounter: Payer: Self-pay | Admitting: Internal Medicine

## 2018-09-17 ENCOUNTER — Ambulatory Visit: Payer: Self-pay | Admitting: Internal Medicine

## 2018-10-22 ENCOUNTER — Ambulatory Visit: Payer: Self-pay | Admitting: Internal Medicine

## 2018-12-14 ENCOUNTER — Encounter: Payer: Self-pay | Admitting: Internal Medicine
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
## 2019-10-23 IMAGING — CR DG FOOT COMPLETE 3+V*L*
3 series · 3 of 3 positions shown · non-contrast
Comparison: None.

CLINICAL DATA: Initial evaluation for acute pain and swelling for 1
week.

EXAM:
LEFT FOOT - COMPLETE 3+ VIEW

[foot ap]
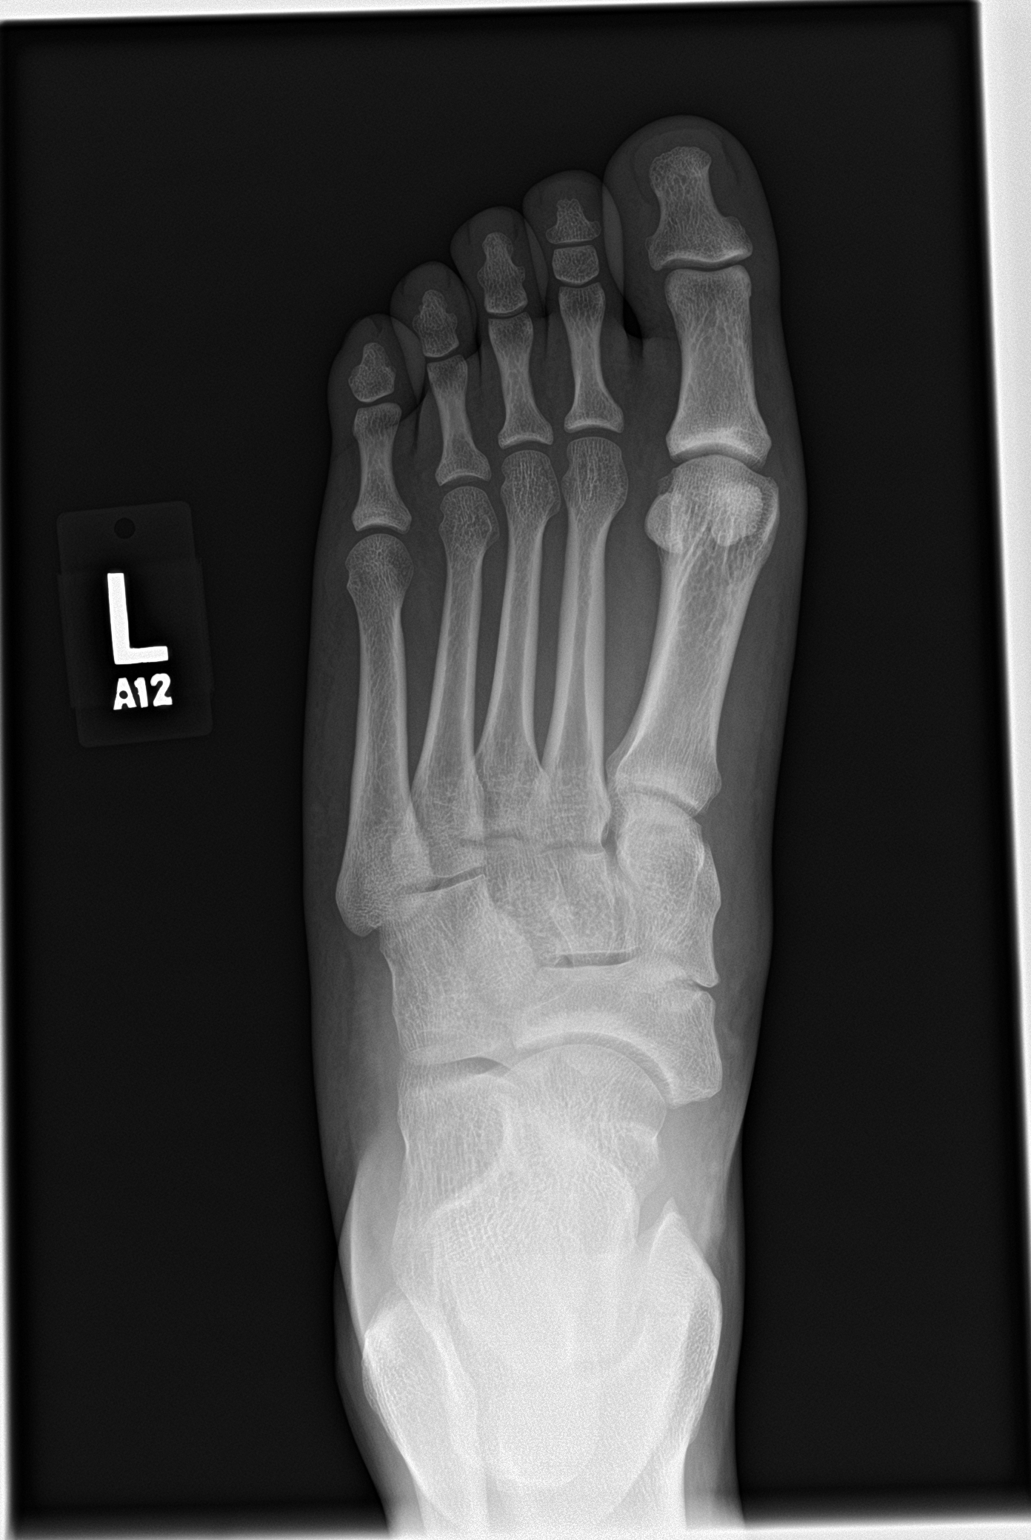

[foot obl]
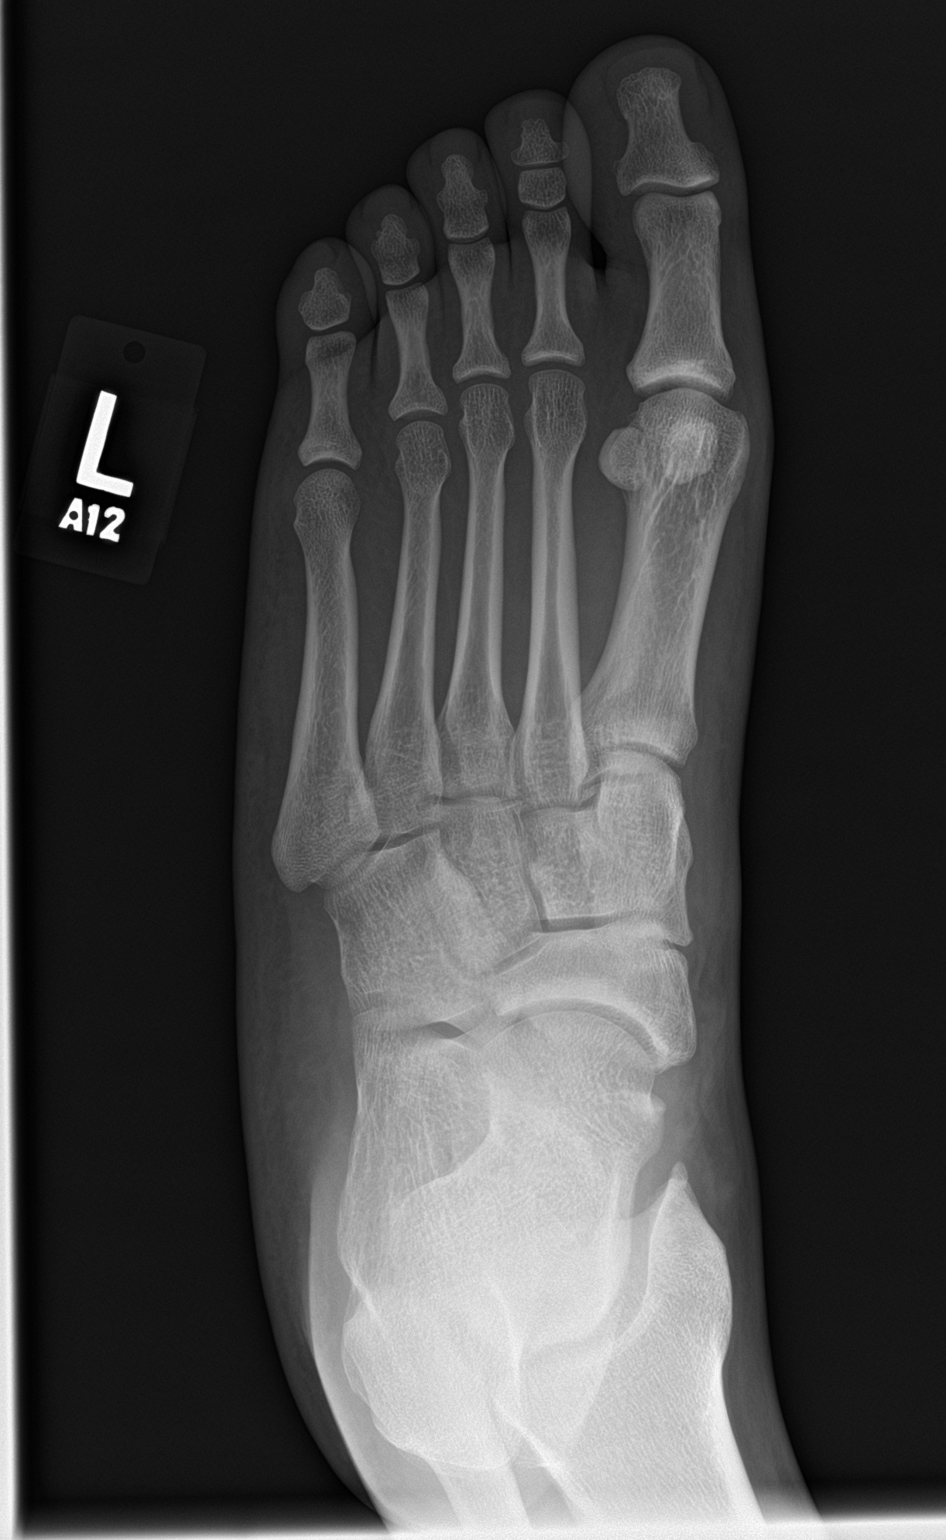

[foot lat]
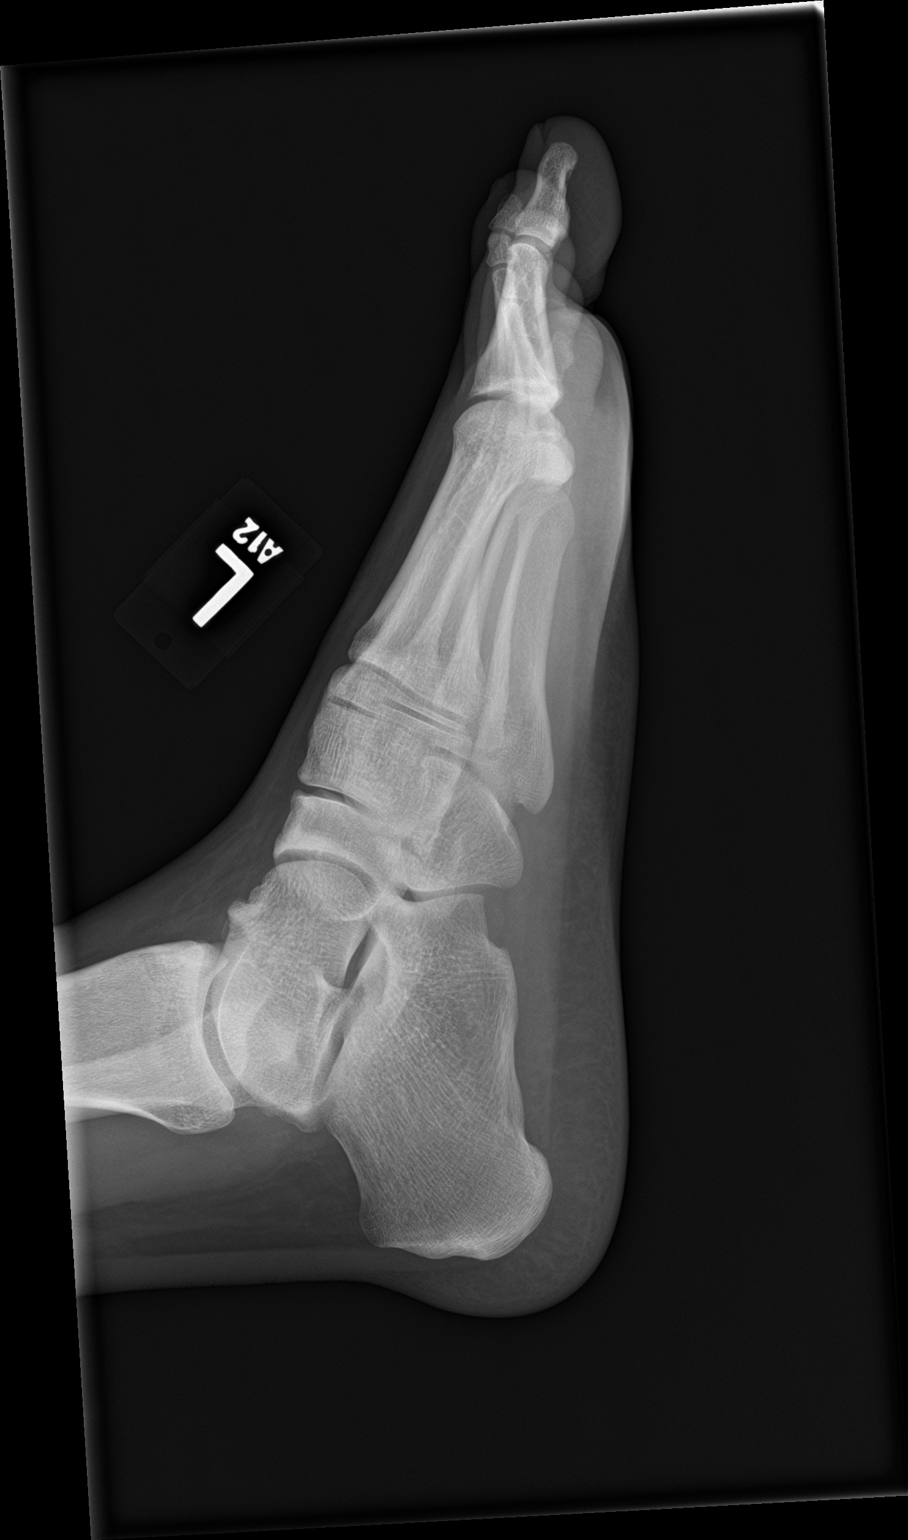

[3 of 3 positions shown; findings below may reference images not displayed]

FINDINGS: There is no evidence of fracture or dislocation. There is no
evidence of arthropathy or other focal bone abnormality. Soft
tissues are unremarkable.
IMPRESSION: Negative.
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
